# Patient Record
Sex: Female | Born: 1990 | Hispanic: Yes | Smoking: Never smoker
Health system: Southern US, Community
[De-identification: ages and names within clinical notes are randomized; demographics above are authoritative.]

## PROBLEM LIST (undated history)

## (undated) DIAGNOSIS — Z789 Other specified health status: Secondary | ICD-10-CM

## (undated) HISTORY — PX: NO PAST SURGERIES: SHX2092

---

## 2017-06-03 NOTE — L&D Delivery Note (Signed)
Patient: Megan Love MRN: [id number]  GBS status: neg  Patient is a 27 y.o. now G1P1001 s/p NSVD at 7172w4d, who was admitted for IOL for GHTN. AROM 4h 6185m prior to delivery with clear fluid.    Delivery Note At 3:29 PM a viable female was delivered via Vaginal, Spontaneous (Presentation:ROA ).  APGAR: 9, 9; weight pending .   Placenta status: spontaneous, intact. Cord: 3 vessel cord   Anesthesia:  Epidural  Episiotomy: None Lacerations:  right labial and left vaginal tear repaired Suture Repair: 3.0 monocryl ct-1 Est. Blood Loss (mL): 399 ml  Mom to postpartum.  Baby to Couplet care / Skin to Skin.  Megan Love 05/08/2018, 4:04 PM   Head delivered ROA. Nuchal cord present and easily reduced prior to delivery. Shoulder and body delivered in usual fashion. Infant with spontaneous cry, placed on mother's abdomen, dried and bulb suctioned. Cord clamped x 2 after 1-minute delay, and cut by family member. Cord blood drawn. Placenta delivered spontaneously with gentle cord traction. Fundus firm with massage and Pitocin. Perineum inspected and found to have right labial and left vaginal laceration, which was repaired with 3-0 monocryl ct-1 with good hemostasis achieved.

## 2018-01-21 ENCOUNTER — Ambulatory Visit (INDEPENDENT_AMBULATORY_CARE_PROVIDER_SITE_OTHER): Payer: Self-pay

## 2018-01-21 ENCOUNTER — Encounter: Payer: Self-pay | Admitting: Family Medicine

## 2018-01-21 DIAGNOSIS — Z3201 Encounter for pregnancy test, result positive: Secondary | ICD-10-CM

## 2018-01-21 DIAGNOSIS — Z3490 Encounter for supervision of normal pregnancy, unspecified, unspecified trimester: Secondary | ICD-10-CM

## 2018-01-21 LAB — POCT PREGNANCY, URINE: PREG TEST UR: POSITIVE — AB

## 2018-01-21 NOTE — Progress Notes (Signed)
Video Interpreter # [id number]  Pt here today for pregnancy test.  Resulted positive.  Pt reports LMP to be sometime in April.  OB US scheduled for August 29th 0845 for viability and dating.  Medications verified.  Front office to provide proof of pregnancy letter.

## 2018-01-29 ENCOUNTER — Encounter (HOSPITAL_COMMUNITY): Payer: Self-pay

## 2018-01-29 ENCOUNTER — Ambulatory Visit (HOSPITAL_COMMUNITY)
Admission: RE | Admit: 2018-01-29 | Discharge: 2018-01-29 | Disposition: A | Discharge status: Home / Self Care | Payer: Self-pay | Source: Ambulatory Visit | Attending: Obstetrics and Gynecology | Admitting: Obstetrics and Gynecology

## 2018-01-29 ENCOUNTER — Other Ambulatory Visit: Payer: Self-pay | Admitting: Obstetrics and Gynecology

## 2018-01-29 DIAGNOSIS — Z3492 Encounter for supervision of normal pregnancy, unspecified, second trimester: Secondary | ICD-10-CM

## 2018-01-29 DIAGNOSIS — Z363 Encounter for antenatal screening for malformations: Secondary | ICD-10-CM

## 2018-01-29 DIAGNOSIS — Z3A26 26 weeks gestation of pregnancy: Secondary | ICD-10-CM

## 2018-01-29 DIAGNOSIS — Z3687 Encounter for antenatal screening for uncertain dates: Secondary | ICD-10-CM | POA: Insufficient documentation

## 2018-01-29 DIAGNOSIS — Z3490 Encounter for supervision of normal pregnancy, unspecified, unspecified trimester: Secondary | ICD-10-CM

## 2018-01-30 ENCOUNTER — Other Ambulatory Visit (HOSPITAL_COMMUNITY): Payer: Self-pay | Admitting: *Deleted

## 2018-01-30 DIAGNOSIS — O0930 Supervision of pregnancy with insufficient antenatal care, unspecified trimester: Secondary | ICD-10-CM

## 2018-02-26 ENCOUNTER — Ambulatory Visit (HOSPITAL_COMMUNITY)
Admission: RE | Admit: 2018-02-26 | Discharge: 2018-02-26 | Disposition: A | Discharge status: Home / Self Care | Payer: Self-pay | Source: Ambulatory Visit | Attending: Obstetrics and Gynecology | Admitting: Obstetrics and Gynecology

## 2018-02-26 ENCOUNTER — Encounter (HOSPITAL_COMMUNITY): Payer: Self-pay

## 2018-02-26 DIAGNOSIS — O0930 Supervision of pregnancy with insufficient antenatal care, unspecified trimester: Secondary | ICD-10-CM

## 2018-02-26 DIAGNOSIS — Z3A30 30 weeks gestation of pregnancy: Secondary | ICD-10-CM | POA: Insufficient documentation

## 2018-02-26 DIAGNOSIS — O0933 Supervision of pregnancy with insufficient antenatal care, third trimester: Secondary | ICD-10-CM | POA: Insufficient documentation

## 2018-02-26 DIAGNOSIS — Z362 Encounter for other antenatal screening follow-up: Secondary | ICD-10-CM | POA: Insufficient documentation

## 2018-02-26 HISTORY — DX: Other specified health status: Z78.9

## 2018-03-02 ENCOUNTER — Encounter: Payer: Self-pay | Admitting: Advanced Practice Midwife

## 2018-03-03 NOTE — BH Specialist Note (Signed)
Integrated Behavioral Health Initial Visit  MRN: [id number] Name: Shawnise Peterkin Villalta  Number of Integrated Behavioral Health Clinician visits:: 1/6 Session Start time: 2: 32  Session End time: 2:44 Total time: 15 minutes  Type of Service: Integrated Behavioral Health- Individual/Family Interpretor:Yes.   Interpretor Name and Language: Spanish    Warm Hand Off Completed.       SUBJECTIVE: Brean Carberry is a 27 y.o. female accompanied by Partner/Significant Other Patient was referred by Leroy Libman, MD for Initial OB introduction to integrated behavioral health services . Patient reports the following symptoms/concerns: Pt and FOB state their concern today is being unfamiliar with the hospital, and uncertainty over which hospital they will have baby ([facility] or [facility]).  Duration of problem: Today; Severity of problem: mild  OBJECTIVE: Mood: Normal and Affect: Appropriate Risk of harm to self or others: No plan to harm self or others  LIFE CONTEXT: Family and Social: Pt lives with FOB School/Work: - Self-Care: - Life Changes: Current pregnancy; recent move  GOALS ADDRESSED: Patient will: INTERVENTIONS: IStandardized Assessments completed: GAD-7 and PHQ 9  ASSESSMENT: Patient currently experiencing Supervision of normal first pregnancy, third trimester   Patient may benefit from Initial OB introduction to integrated behavioral health services .  PLAN: 1. Follow up with behavioral health clinician on : As needed 2. Behavioral recommendations:  -Watch [facility] Virtual Tour again at home; share with support persons -Continue taking prenatal vitamin daily, as recommended by medical provider 3. Referral(s): Integrated [employer] (In Clinic) 4. "From scale of 1-10, how likely are you to follow plan?": 10  Rae Lips, LCSW  Depression screen [facility] 2/9 03/04/2018  Decreased Interest 1  Down, Depressed,  Hopeless 1  PHQ - 2 Score 2  Altered sleeping 1  Tired, decreased energy 1  Change in appetite 1  Feeling bad or failure about yourself  1  Trouble concentrating 0  Moving slowly or fidgety/restless 1  Suicidal thoughts 1  PHQ-9 Score 8   GAD 7 : Generalized Anxiety Score 03/04/2018  Nervous, Anxious, on Edge 0  Control/stop worrying 0  Worry too much - different things 0  Trouble relaxing 0  Restless 0  Easily annoyed or irritable 0  Afraid - awful might happen 0  Total GAD 7 Score 0

## 2018-03-04 ENCOUNTER — Ambulatory Visit (INDEPENDENT_AMBULATORY_CARE_PROVIDER_SITE_OTHER): Payer: Self-pay | Admitting: Obstetrics and Gynecology

## 2018-03-04 ENCOUNTER — Encounter: Payer: Self-pay | Admitting: Obstetrics and Gynecology

## 2018-03-04 ENCOUNTER — Ambulatory Visit: Payer: Self-pay | Admitting: Clinical

## 2018-03-04 VITALS — BP 120/78 | HR 82 | Wt 147.0 lb

## 2018-03-04 DIAGNOSIS — Z3403 Encounter for supervision of normal first pregnancy, third trimester: Secondary | ICD-10-CM

## 2018-03-04 DIAGNOSIS — Z3480 Encounter for supervision of other normal pregnancy, unspecified trimester: Secondary | ICD-10-CM

## 2018-03-04 DIAGNOSIS — Z113 Encounter for screening for infections with a predominantly sexual mode of transmission: Secondary | ICD-10-CM

## 2018-03-04 DIAGNOSIS — Z789 Other specified health status: Secondary | ICD-10-CM

## 2018-03-04 DIAGNOSIS — N898 Other specified noninflammatory disorders of vagina: Secondary | ICD-10-CM

## 2018-03-04 LAB — POCT URINALYSIS DIP (DEVICE)
Bilirubin Urine: NEGATIVE
Glucose, UA: NEGATIVE mg/dL
Hgb urine dipstick: NEGATIVE
Ketones, ur: NEGATIVE mg/dL
Leukocytes, UA: NEGATIVE
NITRITE: NEGATIVE
PH: 7 (ref 5.0–8.0)
PROTEIN: NEGATIVE mg/dL
Specific Gravity, Urine: 1.02 (ref 1.005–1.030)
UROBILINOGEN UA: 0.2 mg/dL (ref 0.0–1.0)

## 2018-03-04 NOTE — Progress Notes (Signed)
INITIAL PRENATAL VISIT NOTE  Subjective:  Megan Love is a 27 y.o. G1P0 at [redacted]w[redacted]d by 26 week [place] being seen today for her initial prenatal visit. She has an obstetric history significant for n/a. She has a medical history significant for n/a.  Patient reports no complaints.  Contractions: Not present. Vag. Bleeding: None.  Movement: Present. Denies leaking of fluid.   Past Medical History:  Diagnosis Date  . Medical history non-contributory     Past Surgical History:  Procedure Laterality Date  . NO PAST SURGERIES      OB History  Gravida Para Term Preterm AB Living  1            SAB TAB Ectopic Multiple Live Births               # Outcome Date GA Lbr Len/2nd Weight Sex Delivery Anes PTL Lv  1 Current             Social History   Socioeconomic History  . Marital status: Significant Other    Spouse name: Not on file  . Number of children: Not on file  . Years of education: Not on file  . Highest education level: Not on file  Occupational History  . Not on file  Social Needs  . Financial resource strain: Not on file  . Food insecurity:    Worry: Not on file    Inability: Not on file  . Transportation needs:    Medical: Not on file    Non-medical: Not on file  Tobacco Use  . Smoking status: Never Smoker  . Smokeless tobacco: Never Used  Substance and Sexual Activity  . Alcohol use: Not Currently  . Drug use: Not Currently  . Sexual activity: Not on file  Lifestyle  . Physical activity:    Days per week: Not on file    Minutes per session: Not on file  . Stress: Not on file  Relationships  . Social connections:    Talks on phone: Not on file    Gets together: Not on file    Attends religious service: Not on file    Active member of club or organization: Not on file    Attends meetings of clubs or organizations: Not on file    Relationship status: Not on file  Other Topics Concern  . Not on file  Social History Narrative  . Not on  file    No family history on file.   Current Outpatient Medications:  .  Prenatal Vit-Fe Fumarate-FA (PRENATAL VITAMIN PO), Take by mouth., Disp: , Rfl:   No Known Allergies  Review of Systems: Negative except for what is mentioned in HPI.  Objective:   Vitals:   03/04/18 1329  BP: 120/78  Pulse: 82  Weight: 147 lb (66.7 kg)   Fetal Status: Fetal Heart Rate (bpm): 154   Movement: Present     Physical Exam: BP 120/78   Pulse 82   Wt 147 lb (66.7 kg)  CONSTITUTIONAL: Well-developed, well-nourished female in no acute distress.  NEUROLOGIC: Alert and oriented to person, place, and time. Normal reflexes, muscle tone coordination. No cranial nerve deficit noted. PSYCHIATRIC: Normal mood and affect. Normal behavior. Normal judgment and thought content. SKIN: Skin is warm and dry. No rash noted. Not diaphoretic. No erythema. No pallor. HENT:  Normocephalic, atraumatic, External right and left ear normal. Oropharynx is clear and moist EYES: Conjunctivae and EOM are normal. Pupils are equal, round, and  reactive to light. No scleral icterus.  NECK: Normal range of motion, supple, no masses CARDIOVASCULAR: Normal heart rate noted, regular rhythm RESPIRATORY: Effort and breath sounds normal, no problems with respiration noted BREASTS: deferred ABDOMEN: Soft, nontender, nondistended, gravid. 30 cm GU: normal appearing external female genitalia, nulliparous normal appearing cervix, thick white discharge in vagina, no lesions noted Bimanual: 30 weeks sized mobile uterus, no adnexal tenderness or masses MUSCULOSKELETAL: Normal range of motion. EXT:  No edema and no tenderness. 2+ distal pulses.  Assessment and Plan:  Pregnancy: G1P0 at [redacted]w[redacted]d by 26 weeks [place]  1. Encounter for supervision of normal first pregnancy in third trimester Late to care, this is first visit - Obstetric Panel, Including HIV - Urine Culture - pap deferred to post partum  2. Language barrier Engineer, structural  used  3. Vaginal discharge Wet prep  Preterm labor symptoms and general obstetric precautions including but not limited to vaginal bleeding, contractions, leaking of fluid and fetal movement were reviewed in detail with the patient.  Please refer to After Visit Summary for other counseling recommendations.   Return in about 2 weeks (around 03/18/2018) for OB visit.  Conan Bowens 03/04/2018 2:53 PM

## 2018-03-05 LAB — OBSTETRIC PANEL, INCLUDING HIV
ANTIBODY SCREEN: NEGATIVE
BASOS: 0 %
Basophils Absolute: 0 10*3/uL (ref 0.0–0.2)
EOS (ABSOLUTE): 0 10*3/uL (ref 0.0–0.4)
EOS: 1 %
HEMATOCRIT: 34.8 % (ref 34.0–46.6)
HEMOGLOBIN: 11.5 g/dL (ref 11.1–15.9)
HEP B S AG: NEGATIVE
HIV Screen 4th Generation wRfx: NONREACTIVE
IMMATURE GRANS (ABS): 0.1 10*3/uL (ref 0.0–0.1)
Immature Granulocytes: 1 %
LYMPHS: 23 %
Lymphocytes Absolute: 1.9 10*3/uL (ref 0.7–3.1)
MCH: 30.6 pg (ref 26.6–33.0)
MCHC: 33 g/dL (ref 31.5–35.7)
MCV: 93 fL (ref 79–97)
MONOCYTES: 6 %
Monocytes Absolute: 0.5 10*3/uL (ref 0.1–0.9)
Neutrophils Absolute: 5.7 10*3/uL (ref 1.4–7.0)
Neutrophils: 69 %
Platelets: 254 10*3/uL (ref 150–450)
RBC: 3.76 x10E6/uL — ABNORMAL LOW (ref 3.77–5.28)
RDW: 12.8 % (ref 12.3–15.4)
RPR: NONREACTIVE
RUBELLA: 2.76 {index} (ref >0.99)
Rh Factor: POSITIVE
WBC: 8.2 10*3/uL (ref 3.4–10.8)

## 2018-03-05 LAB — HEMOGLOBIN A1C
ESTIMATED AVERAGE GLUCOSE: 105 mg/dL
HEMOGLOBIN A1C: 5.3 % (ref 4.8–5.6)

## 2018-03-06 DIAGNOSIS — Z3480 Encounter for supervision of other normal pregnancy, unspecified trimester: Secondary | ICD-10-CM | POA: Insufficient documentation

## 2018-03-06 LAB — CERVICOVAGINAL ANCILLARY ONLY
BACTERIAL VAGINITIS: NEGATIVE
CHLAMYDIA, DNA PROBE: NEGATIVE
Candida vaginitis: NEGATIVE
NEISSERIA GONORRHEA: NEGATIVE
Trichomonas: NEGATIVE

## 2018-03-06 LAB — URINE CULTURE

## 2018-03-09 MED ORDER — CEPHALEXIN 500 MG PO CAPS: 500.0000 mg | ORAL_CAPSULE | Freq: Four times a day (QID) | ORAL | 0 refills | Status: DC

## 2018-03-09 NOTE — Addendum Note (Signed)
Addended by: Leroy Libman on: 03/09/2018 07:16 PM   Modules accepted: Orders

## 2018-03-10 ENCOUNTER — Other Ambulatory Visit: Payer: Self-pay

## 2018-03-10 DIAGNOSIS — Z3403 Encounter for supervision of normal first pregnancy, third trimester: Secondary | ICD-10-CM

## 2018-03-10 MED ORDER — CEPHALEXIN 500 MG PO CAPS: 500.0000 mg | ORAL_CAPSULE | Freq: Four times a day (QID) | ORAL | 0 refills | Status: DC

## 2018-03-10 NOTE — Progress Notes (Signed)
Patient wanted the medication sent to a different pharmacy

## 2018-03-17 ENCOUNTER — Other Ambulatory Visit: Payer: Self-pay | Admitting: *Deleted

## 2018-03-17 DIAGNOSIS — Z3403 Encounter for supervision of normal first pregnancy, third trimester: Secondary | ICD-10-CM

## 2018-03-19 ENCOUNTER — Ambulatory Visit (INDEPENDENT_AMBULATORY_CARE_PROVIDER_SITE_OTHER): Payer: Self-pay | Admitting: Obstetrics and Gynecology

## 2018-03-19 ENCOUNTER — Other Ambulatory Visit: Payer: Self-pay

## 2018-03-19 DIAGNOSIS — Z3403 Encounter for supervision of normal first pregnancy, third trimester: Secondary | ICD-10-CM

## 2018-03-19 DIAGNOSIS — O2343 Unspecified infection of urinary tract in pregnancy, third trimester: Secondary | ICD-10-CM

## 2018-03-19 DIAGNOSIS — Z3480 Encounter for supervision of other normal pregnancy, unspecified trimester: Secondary | ICD-10-CM

## 2018-03-19 DIAGNOSIS — Z23 Encounter for immunization: Secondary | ICD-10-CM

## 2018-03-19 MED ORDER — CEPHALEXIN 500 MG PO CAPS: 500.0000 mg | ORAL_CAPSULE | Freq: Four times a day (QID) | ORAL | 0 refills | Status: DC

## 2018-03-19 NOTE — Progress Notes (Signed)
   PRENATAL VISIT NOTE  Subjective:  Megan Love is a 27 y.o. G1P0 at [redacted]w[redacted]d being seen today for ongoing prenatal care.  She is currently monitored for the following issues for this low-risk pregnancy and has Supervision of other normal pregnancy, antepartum and UTI in pregnancy, antepartum, third trimester on their problem list.  Patient reports no complaints.  Contractions: Not present. Vag. Bleeding: None.  Movement: Present. Denies leaking of fluid.   The following portions of the patient's history were reviewed and updated as appropriate: allergies, current medications, past family history, past medical history, past social history, past surgical history and problem list. Problem list updated.  Objective:   Vitals:   03/19/18 0849  BP: 114/78  Pulse: 75  Weight: 148 lb 6.4 oz (67.3 kg)    Fetal Status: Fetal Heart Rate (bpm): 152 Fundal Height: 33 cm Movement: Present     General:  Alert, oriented and cooperative. Patient is in no acute distress.  Skin: Skin is warm and dry. No rash noted.   Cardiovascular: Normal heart rate noted  Respiratory: Normal respiratory effort, no problems with respiration noted  Abdomen: Soft, gravid, appropriate for gestational age.  Pain/Pressure: Present     Pelvic: Cervical exam deferred        Extremities: Normal range of motion.  Edema: None  Mental Status: Normal mood and affect. Normal behavior. Normal judgment and thought content.   Assessment and Plan:  Pregnancy: G1P0 at [redacted]w[redacted]d  1. Supervision of other normal pregnancy, antepartum  - Flu Vaccine QUAD 36+ mos IM - Tdap vaccine greater than or equal to 7yo IM - Self pay: discussed genetic testing, patient declined due to cost.   2. UTI in pregnancy, antepartum, third trimester  - + urine culture RX Keflex printed and given to Patient   Other orders - cephALEXin (KEFLEX) 500 MG capsule; Take 1 capsule (500 mg total) by mouth 4 (four) times daily.  There are no  diagnoses linked to this encounter. Preterm labor symptoms and general obstetric precautions including but not limited to vaginal bleeding, contractions, leaking of fluid and fetal movement were reviewed in detail with the patient. Please refer to After Visit Summary for other counseling recommendations.  Return in about 2 weeks (around 04/02/2018).  No future appointments.  Venia Carbon, NP

## 2018-03-19 NOTE — Patient Instructions (Addendum)
Vacuna contra la difteria, el tétanos y la tosferina acelular (DTaP): Lo que debe saber  (DTaP Vaccine (Diphtheria, Tetanus, and Pertussis): What You Need to Know)  1. ¿Por qué vacunarse?  La difteria, el tétanos y la tosferina son enfermedades graves causadas por bacterias. La difteria y la tos ferina se contagian de persona a persona. El tétanos ingresa al organismo a través de cortes o heridas.  La DIFTERIA produce la formación de una membrana gruesa que cubre el fondo de la garganta.  · Puede causar problemas respiratorios, parálisis, insuficiencia cardíaca e incluso la muerte.  El TÉTANOS (trismo) provoca la contracción dolorosa de los músculos, por lo general, en todo el cuerpo.  · Puede derivar en la obstrucción de la mandíbula, lo que impide que la persona abra la boca o trague. El tétanos causa la muerte en 2 de cada 10 casos.  La PERTUSIS (tosferina) causa episodios de tos tan intensos que los bebés tienen dificultades para comer, beber y respirar. Estos ataques pueden durar semanas.  · Puede causar neumonía, convulsiones (sacudidas o la mirada perdida), daño cerebral y muerte.  La vacuna contra la difteria, el tétanos y la tosferina acelular (DTaP) puede ayudar a prevenir estas enfermedades. La mayoría de los niños que reciben la vacuna DTaP estarán protegidos durante toda la infancia. Muchos niños contraerían estas enfermedades si interrumpiésemos la vacunación.  La DTaP es una versión más segura de una vacuna anterior llamada DTP. La DTP ya no se usa en los [place].  2. ¿Quiénes y en qué momento deben recibir la vacuna DTaP?  Los niños deben recibir 5 dosis de la vacuna DTaP, una dosis en cada una de estas edades:  · 2 meses  · 4 meses  · 6 meses  · de 15 a 18 meses  · de 4 a 6 años  La DTaP puede aplicarse en el mismo momento que otras vacunas.  3. Algunos niños no deben recibir la vacuna DTaP o deben esperar  · Los niños con enfermedades menores, como un resfrío, pueden vacunarse.  Pero los niños que tienen enfermedades moderadas o graves generalmente deben esperar hasta recuperarse para poder recibir la vacuna DTaP.  · Un niño que ha tenido una reacción alérgica potencialmente mortal después de una dosis de la vacuna DTaP no debe recibir otra dosis.  · Un niño que ha sufrido una enfermedad cerebral o del sistema nervioso en el transcurso de 7 días de haber recibido una dosis de la vacuna DTaP no debe recibir otra dosis.  · Hable con el pediatra si el niño:  ? tuvo una convulsión o se desmayó después de una dosis de la vacuna DTaP,  ? lloró sin parar durante 3 horas o más después de una dosis de la vacuna DTaP,  ? tuvo fiebre de más de 105 °F después de una dosis de la vacuna DTaP.  Consulte a su médico para obtener más información. Algunos de estos niños no deben recibir otra dosis de la vacuna contra la tosferina, pero pueden recibir una vacuna que no la incluya, llamada DT.  4. Niños mayores y adultos  La vacuna DTaP no está autorizada para adolescentes, adultos y niños de 7 años en adelante.  No obstante, las personas de estas edades necesitan protección. La vacuna llamada Tdap es similar a la DTaP. Se recomienda una sola dosis de Tdap para las personas de 11 a 64 años. Otra vacuna llamada Td protege contra el tétanos y la difteria, pero no contra la tosferina. Se recomienda aplicarla cada 10 años. Hay diferentes hojas informativas sobre estas vacunas.  5. ¿Cuáles   son los riesgos de la vacuna DTaP?  Enfermarse de tétanos, difteria o tosferina es mucho más riesgoso que aplicarse la vacuna DTaP.  Sin embargo, una vacuna, al igual que cualquier medicamento, puede causar problemas serios, como reacciones alérgicas graves. El riesgo de que la vacuna DTaP cause daños graves, o la muerte, es sumamente pequeño.  Problemas leves (frecuentes)  · Fiebre (alrededor de 1 de cada 4 niños)  · Enrojecimiento o hinchazón en el lugar de la inyección (alrededor de 1 de cada 4 niños)   · Molestias o dolor con la palpación en el lugar de la inyección (alrededor de 1 de cada 4 niños)  Estos problemas son más frecuentes después de la cuarta y la quinta dosis de la serie DTaP que después de las primeras dosis. A veces, después de la cuarta o la quinta dosis de la vacuna DTaP, se hincha todo el brazo o la pierna donde se aplicó la inyección, lo que dura de 1 a 7 días (alrededor de 1 de cada 30 niños).  Entre los otros problemas leves, se incluyen los siguientes:  · Irritabilidad (alrededor de 1 de cada 3 niños)  · Cansancio o poco apetito (alrededor de 1 de cada 10 niños)  · Vómitos (alrededor de 1 de cada 50 niños)  En general, estos problemas aparecen de 1 a 3 días después de la inyección.  Problemas moderados (poco frecuentes)  · Convulsiones (sacudidas o mirada perdida) (alrededor de 1 de cada 14 000 niños)  · Llanto incesante durante 3 horas o más (alrededor de 1 de cada 1000 niños)  · Fiebre alta, superior a 105 °F (alrededor de 1 de cada 16 000 niños)  Problemas graves (muy poco frecuentes)  · Reacción alérgica grave (menos de 1 caso cada un millón de dosis)  · Se han informado otros diversos problemas graves después de la vacuna DTaP. Estos incluyen:  ? Convulsiones crónicas, coma o disminución de la conciencia.  ? Lesiones cerebrales permanentes.  Estos son tan poco frecuentes que es difícil determinar si la causa es la vacuna.  El control de la fiebre es especialmente importante en los niños que han tenido convulsiones, cualquiera sea el motivo. También es importante si otro familiar ha tenido convulsiones. Puede disminuir la fiebre y el dolor si le da al niño un analgésico sin aspirina en el momento de la aplicación y durante las 24 horas siguientes, según las indicaciones del envase.  6. ¿Qué pasa si hay una reacción grave?  ¿A qué signos debo estar atento?  · Observe todo lo que le preocupe, como signos de una reacción alérgica grave, fiebre muy alta o cambios en el comportamiento.   Los signos de una reacción alérgica grave pueden incluir ronchas, hinchazón de la cara y la garganta, dificultad para respirar, latidos cardíacos acelerados, mareos y debilidad. Pueden comenzar entre unos pocos minutos y algunas horas después de la vacunación.  ¿Qué debo hacer?  · Si usted piensa que se trata de una reacción alérgica grave o de otra emergencia que no puede esperar, llame al 911 o diríjase al hospital más cercano. Sino, llame a su médico.  · Después, la reacción debe informarse al Sistema de Información sobre Efectos Adversos de las Vacunas (Vaccine Adverse Event Reporting System, VAERS). Su médico puede presentar este informe, o puede hacerlo usted mismo a través del sitio web de VAERS, en www.vaers.hhs.gov, o llamando al 1-800-822-7967.  El VAERS es solo para informar reacciones. No brindan consejo médico.  7. Programa Nacional de Compensación de Daños por Vacunas  El Programa Nacional de Compensación de Daños por Vacunas (National Vaccine Injury Compensation Program, VICP) es un programa federal que fue creado   el servicio de salud de su localidad o [address].  Comunquese con los Centros para Air traffic controller y la Prevencin de Child psychotherapist for Disease Control and Prevention , CDC). ? Llame al (412)783-9958 (1-800-CDC-INFO), o ? Visite la pgina Environmental manager en PicCapture.uy Declaracin de informacin sobre la vacuna contra la difteria, el ttanos y la tosferina Manufacturing engineer (DTaP) de los CDC (10/17/05) Esta informacin no tiene Theme park manager el consejo del mdico. Asegrese de hacerle  al mdico cualquier pregunta que tenga. Document Released: 08/16/2008 Document Revised: 06/10/2014 Document Reviewed: 07/01/2013 Elsevier Interactive Patient Education  2017 Elsevier Inc.  [employer] del mtodo anticonceptivo (Contraception Choices) La anticoncepcin (control de la natalidad) es el uso de cualquier mtodo o dispositivo para Location manager. A continuacin se indican algunos de esos mtodos. ANTICONCEPTIVOS HORMONALES  Un pequeo tubo colocado bajo la piel de la parte superior del brazo (implante). El tubo puede Geneticist, molecular durante 3 aos. El implante debe quitarse despus de 3 aos.  Inyecciones que se aplican cada 3 meses.  Pldoras que deben [employer].  Parches que se cambian una vez por semana.  Un anillo que se coloca en la vagina (anillo vaginal). El anillo se deja en su lugar durante 3 semanas y se retira durante 1 semana. Luego se coloca un nuevo anillo en la vagina.  Pldoras para el control de la natalidad despus de Warehouse manager sexo (relaciones sexuales) sin proteccin.  ANTICONCEPTIVOS DE Emeline Darling cubierta delgada que se [place] sobe el pene (condn masculino) que se coloca durante las relaciones sexuales.  Una cubierta blanda y suelta que se coloca en la vagina (condn femenino) antes de las relaciones sexuales.  Un dispositivo de goma que se aplica sobre el cuello del tero (diafragma). Este dispositivo debe ser hecho para usted. Se coloca en la vagina antes de Management consultant. Debe dejarlo colocado en la vagina durante 6 a 8 horas despus de las [employer].  Un capuchn pequeo y [employer] se fija sobre el cuello del tero (capuchn cervical). Este capuchn debe ser hecho para usted. Debe dejarlo colocado en la vagina durante 48 horas despus de las [employer].  Una esponja que se coloca en la vagina antes de Management consultant.  Una sustancia qumica que destruye o impide que los espermatozoides  ingresen al cuello y al tero (espermicida). La sustancia qumica puede ser en crema, gel, espuma o pldoras.  DISPOSITIVO DE CONTROL INTRAUTERINO (DIU)  El DIU es un pequeo dispositivo plstico en forma de T. Se coloca dentro del tero. Hay dos tipos de DIU: ? DIU de cobre. El dispositivo est recubierto en alambre de cobre. El cobre produce un lquido que [employer] espermatozoides. Puede permanecer colocado durante 10 aos. ? DIU hormonal. La hormona impide que ocurra el embarazo. Puede permanecer colocado durante 5 aos.  MTODOS PERMANENTES  La mujer puede hacerse sellar, ligar u obstruir las trompas de [place] durante [place]. Esto impide que el vulo llegue hasta el tero.  El mdico coloca un alambre diminuto o lo inserta en cada una de las trompas de [place]. Esto produce un tejido cicatrizal que obstruye las trompas de [place].  En el hombre pueden ligarse los conductos por los que pasan los espermatozoides (vasectoma).  CONTROL DE LA NATALIDAD POR PLANIFICACIN FAMILIAR NATURAL  La planificacin familiar natural significa no tener Clinical research associate o usar un mtodo anticonceptivo de Warehouse manager en los perodos frtiles de la [place].  Use a un  almanaque para llevar un registro de la extensin de cada perodo y para [place] que puede quedar [place].  Evite tener relaciones sexuales durante la ovulacin.  Use un termmetro para medir la Arts development officer. Tambin reconozca los sntomas de la ovulacin.  El momento de [employer] relaciones sexuales debe ser despus de que la mujer [place]. Use condones para protegerse de las enfermedades de transmisin sexual (ETS). Hgalo independientemente del tipo de [employer] use. Hable con su mdico acerca de cul es el mejor mtodo anticonceptivo para usted. Esta informacin no tiene Theme park manager el consejo del mdico. Asegrese de hacerle al mdico cualquier pregunta que tenga. Document Released:  09/04/2010 Document Revised: 05/25/2013 Document Reviewed: 12/09/2012 Elsevier Interactive Patient Education  2017 [employer].

## 2018-03-20 LAB — GLUCOSE TOLERANCE, 2 HOURS W/ 1HR
GLUCOSE, 1 HOUR: 107 mg/dL (ref 65–179)
GLUCOSE, 2 HOUR: 101 mg/dL (ref 65–152)
Glucose, Fasting: 80 mg/dL (ref 65–91)

## 2018-04-08 ENCOUNTER — Ambulatory Visit (INDEPENDENT_AMBULATORY_CARE_PROVIDER_SITE_OTHER): Payer: Self-pay | Admitting: Nurse Practitioner

## 2018-04-08 VITALS — BP 110/82 | HR 82 | Wt 152.0 lb

## 2018-04-08 DIAGNOSIS — Z113 Encounter for screening for infections with a predominantly sexual mode of transmission: Secondary | ICD-10-CM

## 2018-04-08 DIAGNOSIS — Z3480 Encounter for supervision of other normal pregnancy, unspecified trimester: Secondary | ICD-10-CM

## 2018-04-08 DIAGNOSIS — Z3483 Encounter for supervision of other normal pregnancy, third trimester: Secondary | ICD-10-CM

## 2018-04-08 LAB — OB RESULTS CONSOLE GC/CHLAMYDIA: Gonorrhea: NEGATIVE

## 2018-04-08 LAB — OB RESULTS CONSOLE GBS: STREP GROUP B AG: NEGATIVE

## 2018-04-08 NOTE — Progress Notes (Signed)
    Subjective:  Megan Love is a 27 y.o. G1P0 at [redacted]w[redacted]d being seen today for ongoing prenatal care.  She is currently monitored for the following issues for this low-risk pregnancy and has Supervision of other normal pregnancy, antepartum and UTI in pregnancy, antepartum, third trimester on their problem list.  Patient reports no complaints.  Contractions: Not present. Vag. Bleeding: None.  Movement: Present. Denies leaking of fluid.   The following portions of the patient's history were reviewed and updated as appropriate: allergies, current medications, past family history, past medical history, past social history, past surgical history and problem list. Problem list updated.  Objective:   Vitals:   04/08/18 1042  BP: 110/82  Pulse: 82  Weight: 152 lb (68.9 kg)    Fetal Status: Fetal Heart Rate (bpm): 146 Fundal Height: 34 cm Movement: Present     General:  Alert, oriented and cooperative. Patient is in no acute distress.  Skin: Skin is warm and dry. No rash noted.   Cardiovascular: Normal heart rate noted  Respiratory: Normal respiratory effort, no problems with respiration noted  Abdomen: Soft, gravid, appropriate for gestational age. Pain/Pressure: Absent     Pelvic:  Cervical exam deferred        Extremities: Normal range of motion.  Edema: None  Mental Status: Normal mood and affect. Normal behavior. Normal judgment and thought content.   Urinalysis:      Assessment and Plan:  Pregnancy: G1P0 at [redacted]w[redacted]d  1. Supervision of other normal pregnancy, antepartum  - Culture, beta strep (group b only) - GC/Chlamydia probe amp ([place])not at [facility]  Term labor symptoms and general obstetric precautions including but not limited to vaginal bleeding, contractions, leaking of fluid and fetal movement were reviewed in detail with the patient. Please refer to After Visit Summary for other counseling recommendations.  Return in about 1 week (around  04/15/2018).  Nolene Bernheim, RN, MSN, NP-BC Nurse Practitioner, [facility] [employer], [facility] 04/08/2018 1:49 PM

## 2018-04-08 NOTE — Patient Instructions (Addendum)
AREA PEDIATRIC/FAMILY PRACTICE PHYSICIANS  [facility] [address], [address] [place], [place]  [address] Phone - [phone]   Fax - [phone]  [facility] [address] [place], [place] [address] Phone - [phone]   Fax - [phone]  [facility] [address] [place], [place]  [address] Phone - [phone]   Fax - [phone]  [facility] [address] [place], [place]  [address] Phone - [phone]   Fax - [phone]  [facility] [address] [place], [place]  [address] Phone - [phone]   Fax - [phone]  [facility] [address] [place], [place]  [address] Phone - [phone]   Fax - [phone]  [facility] [address] [place], [place]  [address] Phone - [phone]   Fax - [phone]  [facility] [address] [place], [place]  [address] Phone - [phone]   Fax - [phone]  [facility] [address] [place], [place]  [address] Phone - [phone]   Fax - [phone] [facility] [address] [place], [place]  [address] Phone - [phone]   Fax - [phone]  [facility] [address] [place], [place]  [address] Phone - [phone]   Fax - [phone]  [facility] [address] [place], [place]  [address] Phone - [phone]   Fax - [phone]  [facility] [address] [place], [place]  [address] Phone - [phone]   Fax - [phone]  [facility]IL[facility] GOSRANI [address] [place], [place]  [address] Phone - [phone]  [facility] [address] [place], [place]  [address] Phone - [phone]   Fax - [phone]  [facility] [address] [place], [place]  [address] Phone - [phone]   Fax - [phone]  [facility] [address] [place], [place]  [address] Phone - [phone]   Fax - [phone]  [facility] [address] [place], [place]  [address] Phone - [phone]   Fax - [phone]   [facility] [address] [place], [place]  [address] Phone - [phone]   Fax - [phone]  [facility] [address] [place], [place]  [address] Phone - [phone]   Fax - [phone]  [facility] RUB[facility] [address] [place], [place]  [address] Phone - [phone]   Fax - [phone]  [facility] [address] , [place]  [address] Phone - [phone]   Fax - [phone]  [facility] [address] , [place]  [address] Phone - [phone]   Fax - [phone] [facility] [address] [place], [place]  [address] Phone - [phone]   Fax - [phone]  [facility] [address] [place], [place]  [address] Phone - [phone]   Fax - [phone]  [facility] [address] [place], [place]  [address] Phone - [phone]   Fax - [phone]  [facility] Charlene Flemming MD [address] [place]  [address] Phone [phone]  Fax [phone]    Braxton Hicks Contractions Contractions of the uterus can occur throughout pregnancy, but they   are not always a sign that you are in labor. You may have practice contractions called Braxton Hicks contractions. These false labor contractions are sometimes confused with true labor. What are Braxton Hicks contractions? Braxton Hicks contractions are tightening movements that occur in the muscles of the uterus before labor. Unlike true labor contractions, these contractions do not result in  opening (dilation) and thinning of the cervix. Toward the end of pregnancy (32-34 weeks), Braxton Hicks contractions can happen more often and may become stronger. These contractions are sometimes difficult to tell apart from true labor because they can be very uncomfortable. You should not feel embarrassed if you go to the hospital with false labor. Sometimes, the only way to tell if you are in true labor is for your health care provider to look for changes in the cervix. The health care provider will do a physical exam and may monitor your contractions. If you are not in true labor, the exam should show that your cervix is not dilating and your water has not broken. If there are other health problems associated with your pregnancy, it is completely safe for you to be sent home with false labor. You may continue to have Braxton Hicks contractions until you go into true labor. How to tell the difference between true labor and false labor True labor  Contractions last 30-70 seconds.  Contractions become very regular.  Discomfort is usually felt in the top of the uterus, and it spreads to the lower abdomen and low back.  Contractions do not go away with walking.  Contractions usually become more intense and increase in frequency.  The cervix dilates and gets thinner. False labor  Contractions are usually shorter and not as strong as true labor contractions.  Contractions are usually irregular.  Contractions are often felt in the front of the lower abdomen and in the groin.  Contractions may go away when you walk around or change positions while lying down.  Contractions get weaker and are shorter-lasting as time goes on.  The cervix usually does not dilate or become thin. Follow these instructions at home:  Take over-the-counter and prescription medicines only as told by your health care provider.  Keep up with your usual exercises and follow other instructions from your health care  provider.  Eat and drink lightly if you think you are going into labor.  If Braxton Hicks contractions are making you uncomfortable: ? Change your position from lying down or resting to walking, or change from walking to resting. ? Sit and rest in a tub of warm water. ? Drink enough fluid to keep your urine pale yellow. Dehydration may cause these contractions. ? Do slow and deep breathing several times an hour.  Keep all follow-up prenatal visits as told by your health care provider. This is important. Contact a health care provider if:  You have a fever.  You have continuous pain in your abdomen. Get help right away if:  Your contractions become stronger, more regular, and closer together.  You have fluid leaking or gushing from your vagina.  You pass blood-tinged mucus (bloody show).  You have bleeding from your vagina.  You have low back pain that you never had before.  You feel your baby's head pushing down and causing pelvic pressure.  Your baby is not moving inside you as much as it used to. Summary  Contractions that occur before labor are called Braxton Hicks contractions, false labor, or practice contractions.  Braxton Hicks contractions are   usually shorter, weaker, farther apart, and less regular than true labor contractions. True labor contractions usually become progressively stronger and regular and they become more frequent.  Manage discomfort from Braxton Hicks contractions by changing position, resting in a warm bath, drinking plenty of water, or practicing deep breathing. This information is not intended to replace advice given to you by your health care provider. Make sure you discuss any questions you have with your health care provider. Document Released: 10/03/2016 Document Revised: 10/03/2016 Document Reviewed: 10/03/2016 Elsevier Interactive Patient Education  2018 Elsevier Inc.  

## 2018-04-09 LAB — GC/CHLAMYDIA PROBE AMP (~~LOC~~) NOT AT ARMC
Chlamydia: NEGATIVE
Neisseria Gonorrhea: NEGATIVE

## 2018-04-12 LAB — CULTURE, BETA STREP (GROUP B ONLY): Strep Gp B Culture: NEGATIVE

## 2018-04-15 ENCOUNTER — Ambulatory Visit (INDEPENDENT_AMBULATORY_CARE_PROVIDER_SITE_OTHER): Payer: Self-pay | Admitting: Nurse Practitioner

## 2018-04-15 VITALS — BP 138/64 | HR 74 | Wt 155.7 lb

## 2018-04-15 DIAGNOSIS — Z3480 Encounter for supervision of other normal pregnancy, unspecified trimester: Secondary | ICD-10-CM

## 2018-04-15 DIAGNOSIS — Z3483 Encounter for supervision of other normal pregnancy, third trimester: Secondary | ICD-10-CM

## 2018-04-15 NOTE — Progress Notes (Addendum)
    Subjective:  Megan Love is a 27 y.o. G1P0 at 6348w2d being seen today for ongoing prenatal care.  She is currently monitored for the following issues for this low-risk pregnancy and has Supervision of other normal pregnancy, antepartum and UTI in pregnancy, antepartum, third trimester on their problem list.  Patient reports no complaints.  Contractions: Not present. Vag. Bleeding: None.  Movement: Present. Denies leaking of fluid.   The following portions of the patient's history were reviewed and updated as appropriate: allergies, current medications, past family history, past medical history, past social history, past surgical history and problem list. Problem list updated.  Objective:   Vitals:   04/15/18 1529  BP: 138/64  Pulse: 74  Weight: 155 lb 11.2 oz (70.6 kg)    Fetal Status: Fetal Heart Rate (bpm): 152 Fundal Height: 35 cm Movement: Present     General:  Alert, oriented and cooperative. Patient is in no acute distress.  Skin: Skin is warm and dry. No rash noted.   Cardiovascular: Normal heart rate noted  Respiratory: Normal respiratory effort, no problems with respiration noted  Abdomen: Soft, gravid, appropriate for gestational age. Pain/Pressure: Absent     Pelvic:  Cervical exam deferred        Extremities: Normal range of motion.  Edema: None  Mental Status: Normal mood and affect. Normal behavior. Normal judgment and thought content.   Urinalysis:      Assessment and Plan:  Pregnancy: G1P0 at 948w2d  1. Supervision of other normal pregnancy, antepartum Never has had urine culture after finishing medication for UTI so will do urine culture today.  Does not have any symptoms today and previously did have symptoms.  Term labor symptoms and general obstetric precautions including but not limited to vaginal bleeding, contractions, leaking of fluid and fetal movement were reviewed in detail with the patient. Please refer to After Visit Summary for  other counseling recommendations.  Return in about 1 week (around 04/22/2018).  Nolene BernheimERRI BURLESON, RN, MSN, NP-BC Nurse Practitioner, [facility] [employer], [facility] 04/15/2018 3:56 PM

## 2018-04-17 LAB — URINE CULTURE, OB REFLEX: ORGANISM ID, BACTERIA: NO GROWTH

## 2018-04-17 LAB — CULTURE, OB URINE

## 2018-04-22 ENCOUNTER — Ambulatory Visit (INDEPENDENT_AMBULATORY_CARE_PROVIDER_SITE_OTHER): Payer: Self-pay | Admitting: Nurse Practitioner

## 2018-04-22 VITALS — BP 118/78 | HR 55 | Wt 156.6 lb

## 2018-04-22 DIAGNOSIS — Z3A38 38 weeks gestation of pregnancy: Secondary | ICD-10-CM

## 2018-04-22 DIAGNOSIS — Z3480 Encounter for supervision of other normal pregnancy, unspecified trimester: Secondary | ICD-10-CM

## 2018-04-22 DIAGNOSIS — Z3483 Encounter for supervision of other normal pregnancy, third trimester: Secondary | ICD-10-CM

## 2018-04-22 NOTE — Patient Instructions (Signed)
Braxton Hicks Contractions °Contractions of the uterus can occur throughout pregnancy, but they are not always a sign that you are in labor. You may have practice contractions called Braxton Hicks contractions. These false labor contractions are sometimes confused with true labor. °What are Braxton Hicks contractions? °Braxton Hicks contractions are tightening movements that occur in the muscles of the uterus before labor. Unlike true labor contractions, these contractions do not result in opening (dilation) and thinning of the cervix. Toward the end of pregnancy (32-34 weeks), Braxton Hicks contractions can happen more often and may become stronger. These contractions are sometimes difficult to tell apart from true labor because they can be very uncomfortable. You should not feel embarrassed if you go to the hospital with false labor. °Sometimes, the only way to tell if you are in true labor is for your health care provider to look for changes in the cervix. The health care provider will do a physical exam and may monitor your contractions. If you are not in true labor, the exam should show that your cervix is not dilating and your water has not broken. °If there are other health problems associated with your pregnancy, it is completely safe for you to be sent home with false labor. You may continue to have Braxton Hicks contractions until you go into true labor. °How to tell the difference between true labor and false labor °True labor °· Contractions last 30-70 seconds. °· Contractions become very regular. °· Discomfort is usually felt in the top of the uterus, and it spreads to the lower abdomen and low back. °· Contractions do not go away with walking. °· Contractions usually become more intense and increase in frequency. °· The cervix dilates and gets thinner. °False labor °· Contractions are usually shorter and not as strong as true labor contractions. °· Contractions are usually irregular. °· Contractions  are often felt in the front of the lower abdomen and in the groin. °· Contractions may go away when you walk around or change positions while lying down. °· Contractions get weaker and are shorter-lasting as time goes on. °· The cervix usually does not dilate or become thin. °Follow these instructions at home: °· Take over-the-counter and prescription medicines only as told by your health care provider. °· Keep up with your usual exercises and follow other instructions from your health care provider. °· Eat and drink lightly if you think you are going into labor. °· If Braxton Hicks contractions are making you uncomfortable: °? Change your position from lying down or resting to walking, or change from walking to resting. °? Sit and rest in a tub of warm water. °? Drink enough fluid to keep your urine pale yellow. Dehydration may cause these contractions. °? Do slow and deep breathing several times an hour. °· Keep all follow-up prenatal visits as told by your health care provider. This is important. °Contact a health care provider if: °· You have a fever. °· You have continuous pain in your abdomen. °Get help right away if: °· Your contractions become stronger, more regular, and closer together. °· You have fluid leaking or gushing from your vagina. °· You pass blood-tinged mucus (bloody show). °· You have bleeding from your vagina. °· You have low back pain that you never had before. °· You feel your baby’s head pushing down and causing pelvic pressure. °· Your baby is not moving inside you as much as it used to. °Summary °· Contractions that occur before labor are called Braxton   Hicks contractions, false labor, or practice contractions. °· Braxton Hicks contractions are usually shorter, weaker, farther apart, and less regular than true labor contractions. True labor contractions usually become progressively stronger and regular and they become more frequent. °· Manage discomfort from Braxton Hicks contractions by  changing position, resting in a warm bath, drinking plenty of water, or practicing deep breathing. °This information is not intended to replace advice given to you by your health care provider. Make sure you discuss any questions you have with your health care provider. °Document Released: 10/03/2016 Document Revised: 10/03/2016 Document Reviewed: 10/03/2016 °Elsevier Interactive Patient Education © 2018 Elsevier Inc. ° °

## 2018-04-22 NOTE — Progress Notes (Signed)
    Subjective:  Megan Love is a 27 y.o. G1P0 at 7337w2d being seen today for ongoing prenatal care.  She is currently monitored for the following issues for this low-risk pregnancy and has Supervision of other normal pregnancy, antepartum and UTI in pregnancy, antepartum, third trimester on their problem list.  Patient reports no complaints.  Contractions: Not present. Vag. Bleeding: None.  Movement: Present. Denies leaking of fluid.   The following portions of the patient's history were reviewed and updated as appropriate: allergies, current medications, past family history, past medical history, past social history, past surgical history and problem list. Problem list updated.  Objective:   Vitals:   04/22/18 0915  BP: 118/78  Pulse: (!) 55  Weight: 156 lb 9.6 oz (71 kg)    Fetal Status: Fetal Heart Rate (bpm): 130 Fundal Height: 37 cm Movement: Present     General:  Alert, oriented and cooperative. Patient is in no acute distress.  Skin: Skin is warm and dry. No rash noted.   Cardiovascular: Normal heart rate noted  Respiratory: Normal respiratory effort, no problems with respiration noted  Abdomen: Soft, gravid, appropriate for gestational age. Pain/Pressure: Absent     Pelvic:  Cervical exam deferred        Extremities: Normal range of motion.  Edema: None  Mental Status: Normal mood and affect. Normal behavior. Normal judgment and thought content.   Urinalysis:      Assessment and Plan:  Pregnancy: G1P0 at 6537w2d  1. Supervision of other normal pregnancy, antepartum Doing well.  Term labor symptoms and general obstetric precautions including but not limited to vaginal bleeding, contractions, leaking of fluid and fetal movement were reviewed in detail with the patient. Please refer to After Visit Summary for other counseling recommendations.  Return in about 1 week (around 04/29/2018).  Nolene BernheimERRI Lynett Brasil, RN, MSN, NP-BC Nurse Practitioner, [facility]  [facility] [employer], [facility] 04/22/2018 9:39 AM

## 2018-04-29 ENCOUNTER — Ambulatory Visit (INDEPENDENT_AMBULATORY_CARE_PROVIDER_SITE_OTHER): Payer: Self-pay | Admitting: Internal Medicine

## 2018-04-29 ENCOUNTER — Inpatient Hospital Stay (HOSPITAL_COMMUNITY)
Admission: AD | Admit: 2018-04-29 | Discharge: 2018-04-29 | Disposition: A | Discharge status: Home / Self Care | Payer: Self-pay | Source: Ambulatory Visit | Attending: Family Medicine | Admitting: Family Medicine

## 2018-04-29 ENCOUNTER — Other Ambulatory Visit: Payer: Self-pay

## 2018-04-29 ENCOUNTER — Encounter (HOSPITAL_COMMUNITY): Payer: Self-pay

## 2018-04-29 VITALS — BP 124/91 | HR 99 | Wt 159.7 lb

## 2018-04-29 DIAGNOSIS — Z3A39 39 weeks gestation of pregnancy: Secondary | ICD-10-CM | POA: Insufficient documentation

## 2018-04-29 DIAGNOSIS — O163 Unspecified maternal hypertension, third trimester: Secondary | ICD-10-CM

## 2018-04-29 DIAGNOSIS — Z3480 Encounter for supervision of other normal pregnancy, unspecified trimester: Secondary | ICD-10-CM

## 2018-04-29 DIAGNOSIS — O133 Gestational [pregnancy-induced] hypertension without significant proteinuria, third trimester: Secondary | ICD-10-CM

## 2018-04-29 LAB — URINALYSIS, ROUTINE W REFLEX MICROSCOPIC
Bilirubin Urine: NEGATIVE
Glucose, UA: NEGATIVE mg/dL
HGB URINE DIPSTICK: NEGATIVE
KETONES UR: NEGATIVE mg/dL
Leukocytes, UA: NEGATIVE
Nitrite: NEGATIVE
PROTEIN: NEGATIVE mg/dL
Specific Gravity, Urine: 1.01 (ref 1.005–1.030)
pH: 7 (ref 5.0–8.0)

## 2018-04-29 LAB — CBC
HEMATOCRIT: 39.6 % (ref 36.0–46.0)
HEMOGLOBIN: 13 g/dL (ref 12.0–15.0)
MCH: 30.2 pg (ref 26.0–34.0)
MCHC: 32.8 g/dL (ref 30.0–36.0)
MCV: 91.9 fL (ref 80.0–100.0)
Platelets: 226 10*3/uL (ref 150–400)
RBC: 4.31 MIL/uL (ref 3.87–5.11)
RDW: 13.7 % (ref 11.5–15.5)
WBC: 10.5 10*3/uL (ref 4.0–10.5)
nRBC: 0 % (ref 0.0–0.2)

## 2018-04-29 LAB — COMPREHENSIVE METABOLIC PANEL
ALK PHOS: 215 U/L — AB (ref 38–126)
ALT: 23 U/L (ref 0–44)
ANION GAP: 11 (ref 5–15)
AST: 23 U/L (ref 15–41)
Albumin: 3.4 g/dL — ABNORMAL LOW (ref 3.5–5.0)
BILIRUBIN TOTAL: 0.2 mg/dL — AB (ref 0.3–1.2)
BUN: 12 mg/dL (ref 6–20)
CALCIUM: 9.2 mg/dL (ref 8.9–10.3)
CO2: 20 mmol/L — ABNORMAL LOW (ref 22–32)
Chloride: 106 mmol/L (ref 98–111)
Creatinine, Ser: 0.71 mg/dL (ref 0.44–1.00)
GFR calc non Af Amer: >60 mL/min (ref >60)
Glucose, Bld: 89 mg/dL (ref 70–99)
Potassium: 3.6 mmol/L (ref 3.5–5.1)
Sodium: 137 mmol/L (ref 135–145)
TOTAL PROTEIN: 6.5 g/dL (ref 6.5–8.1)

## 2018-04-29 LAB — PROTEIN / CREATININE RATIO, URINE
Creatinine, Urine: 65 mg/dL
PROTEIN CREATININE RATIO: 0.11 mg/mg{creat} (ref 0.00–0.15)
TOTAL PROTEIN, URINE: 7 mg/dL

## 2018-04-29 NOTE — Discharge Instructions (Signed)
Hipertensin durante el embarazo (Hypertension During Pregnancy) La hipertensin, conocida comnmente como presin arterial alta, se produce cuando la sangre bombea en las arterias con mucha fuerza. Las arterias son vasos sanguneos que transportan la sangre desde el corazn al resto del cuerpo. La hipertensin durante el embarazo puede causar problemas para usted y el beb. Es posible que el beb no tenga el peso adecuado al nacer o puede que nazca antes de tiempo (prematuro). En los casos muy graves de hipertensin durante el embarazo puede estar en peligro la vida. Durante el embarazo se pueden presentar diferentes tipos de hipertensin arterial. Estos incluyen los siguientes:  Hipertensin crnica. Esto puede suceder en las siguientes ocasiones: ? Cuando una mujer sufre de hipertensin antes del embarazo y contina con hipertensin durante este. ? Cuando una mujer sufre de hipertensin arterial antes de las 20semanas de embarazo y contina durante todo el embarazo.  Hipertensin gestacional. Esta se desarrolla a partir de la semana 20de embarazo.  Preeclampsia, tambin se la conoce como toxemia del embarazo. Es un tipo muy grave de hipertensin que se desarrolla solo durante el embarazo. Afecta a todo el cuerpo y puede ser muy peligrosa para la madre y el beb. La hipertensin gestacional y preeclampsia por lo general, desaparecen en un perodo de 6semanas despus del nacimiento del beb. Las mujeres que sufren de hipertensin durante el embarazo tienen una mayor probabilidad de desarrollar hipertensin en etapas posteriores de la vida o en embarazos futuros. CAUSAS Se desconoce la causa exacta de la hipertensin. FACTORES DE RIESGO Existen ciertos factores que aumentan las probabilidades de que desarrolle hipertensin durante el embarazo. Estos incluyen los siguientes:  Haber sufrido hipertensin durante un embarazo anterior o antes del embarazo.  Tener sobrepeso.  Ser mayor de  40aos.  Estar embarazada por primera vez o de ms de un beb.  Embarazarse a travs de mtodos de fertilizacin como FIV (fertilizacin in vitro).  Tener diabetes, problemas renales o lupus eritematoso sistmico.  Tener antecedentes familiares de hipertensin. SNTOMAS La hipertensin crnica y gestacional en raras ocasiones provoca sntomas. La preeclampsia causa sntomas, que pueden incluir, entre otros:  Aumento de las protenas en la orina. El mdico la controlar en cada visita prenatal.  Dolor de cabeza intenso.  Aumento repentino de peso.  Hinchazn de las manos, del rostro, de las piernas y de los pies.  Nuseas y vmitos.  Problemas visuales, como visin doble o borrosa.  Adormecimiento del rostro, de los brazos, de las piernas y de los pies.  Mareos.  Hablar arrastrando las palabras.  Sensibilidad a las luces brillantes.  Dolor abdominal.  Espasmos. DIAGNSTICO Es posible que se le diagnostique hipertensin durante un control prenatal de rutina. En cada visita prenatal, es posible que le realicen los siguientes exmenes:  Un anlisis de orina para determinar si tiene grandes cantidades de protena en la orina.  Controle su presin arterial. La lectura de la presin arterial se registra con dos nmeros, por ejemplo "120 sobre 80" (o 120/80). El primer nmero ("superior") es la presin sistlica. Es la medida de la presin de las arterias cuando el corazn late. El segundo nmero ("inferior") es la presin diastlica. Es la medida de la presin en las arterias cuando el corazn se relaja entre latidos. La presin sangunea se mide en una unidad llamada mmHg. Una lectura normal de la presin arterial sera: ? Presin sistlica: por debajo de 120. ? Presin diastlica: por debajo de 80. El tipo de hipertensin que le diagnostiquen depender de los resultados de   las pruebas y de cundo se desarrollaron los sntomas.  La hipertensin crnica frecuentemente se  diagnostica antes de las 20semanas de embarazo.  La hipertensin gestacional frecuentemente se diagnostica despus de las 20semanas de embarazo.  La hipertensin con grandes cantidades de protenas en la orina se diagnostica como preeclampsia.  Las mediciones de la presin arterial de ms de 160sistlica o 110diastlica son un signo de preeclampsia grave. TRATAMIENTO El tratamiento para la hipertensin durante el embarazo difiere segn el tipo de hipertensin que tiene y de su gravedad.  Si toma medicamentos inhibidores de la enzima convertidora de la angiotensina (IECA) para tratar la hipertensin crnica, es posible que deba cambiar los medicamentos. Los medicamentos inhibidores de la enzima convertidora de la angiotensina (IECA) no deben tomarse durante el embarazo.  Si usted tiene hipertensin gestacional, tendr que tomar un medicamento para la presin arterial.  Su mdico puede recomendarle que tome una aspirina de dosis baja todos los das, a fin de ayudar a prevenir la hipertensin durante el embarazo, si est en riesgo de padecer preeclampsia.  Si tiene preeclampsia grave, es posible que deba hospitalizarse para que usted y el beb puedan ser controlados atentamente. Puede ser que necesite tomar medicamentos (sulfato de magnesio) para prevenir las convulsiones y reducir la presin arterial. El medicamento se puede administrar como una inyeccin o por va intravenosa (IV).  En ciertos casos, si su afeccin empeora, es posible que necesite dar a luz de forma ms temprana. INSTRUCCIONES PARA EL CUIDADO EN EL HOGAR Comida y bebida  Beba suficiente lquido para mantener la orina clara o de color amarillo plido.  Mantenga una dieta saludable con bajo contenido de sal (sodio). No agregue sal a las comidas. Verifique las etiquetas de los alimentos para conocer cunto sodio hay en una comida o una bebida. Estilo de vida  No consuma ningn producto que contenga nicotina o tabaco, como  cigarrillos y cigarrillos electrnicos. Si necesita ayuda para dejar de fumar, consulte al mdico.  No consuma alcohol.  Evite la cafena.  Evite las situaciones estresantes tanto como sea posible. Haga reposo y duerma bien. Instrucciones generales  Tome los medicamentos de venta libre y los recetados solamente como se lo haya indicado el mdico.  Acustese sobre el lado izquierdo mientras hace reposo. De este modo, se quita la presin del beb.  Eleve las piernas mientras est sentada o recostada. Intente colocar algunas almohadas debajo de las pantorrillas.  Haga ejercicios regularmente. Consulte al mdico qu tipos de ejercicios son seguros para usted.  Concurra a todas las visitas de control prenatales y de seguimiento como se lo haya indicado el mdico. Esto es importante. SOLICITE ATENCIN MDICA SI:  Tiene sntomas que su mdico le ha informado pueden requerir ms tratamiento o control, por ejemplo: ? Fiebre. ? Vmitos. ? Dolor de cabeza. SOLICITE ATENCIN MDICA DE INMEDIATO SI:  Tiene dolor abdominal intenso o vmitos que no mejoran con el tratamiento.  Presenta hinchazn repentina en las manos, los tobillos o el rostro.  Aumenta ms de 4libras (1,8kg) o ms en una semana.  Tiene hemorragia vaginal o tiene sangre en la orina.  No siente que el beb se mueva de forma habitual.  Tiene visin doble o borrosa.  Tiene calambres o espasmos musculares repentinos.  Le falta el aire.  Sus labios o uas comienzan a volverse azules. Esta informacin no tiene como fin reemplazar el consejo del mdico. Asegrese de hacerle al mdico cualquier pregunta que tenga. Document Released: 05/09/2011 Document Revised: 06/10/2014 Document Reviewed: 11/03/2015   Elsevier Interactive Patient Education  2018 [employer]vier Inc[employer]  

## 2018-04-29 NOTE — Progress Notes (Signed)
Megan Love is 27 y.o. G1P0 female at 5129w2d presenting for her scheduled OB follow up. At triage, BPs 122/96 and 124/91. No prior history of elevated BPs. Given GA and elevated diastolic pressures, patient sent to MAU for BP monitoring and Pre-E work up. Patient asymptomatic denying headaches, vision changes, RUQ pain. No SOB or edema. Discussed with patient that may warrant IOL if found to have new diagnosis of gHTN or Pre-Eclampsia. Informed MAU provider of patient and patient escorted to MAU by clinic RN.   Marcy Sirenatherine Gedalia Mcmillon, D.O. [facility] Family Medicine Fellow, [facility], [facility] 04/29/2018, 4:22 PM

## 2018-04-29 NOTE — MAU Provider Note (Signed)
  History   The history was obtained with assistance from a live Engineer, structuralpanish translator.  CSN: [id number]  Arrival date and time: 04/29/18 1629   First Provider Initiated Contact with Patient 04/29/18 1704      Chief Complaint  Patient presents with  . Hypertension   HPI  This is a 27 year old G1P0 at 1949w2d who presents from the office for evaluation of elevated BP. At triage this afternoon, BP's 122/96 and 124/91. She denies headache, vision changes, RUQ tenderness, SOB, edema. She denies vaginal bleeding or leakage of fluid. She endorses mild, irregular ctx. She reports positive fetal movement. She denies hx of elevated BP's. She was sent to the MAU for further monitoring and evaluation.    Past Medical History:  Diagnosis Date  . Medical history non-contributory     Past Surgical History:  Procedure Laterality Date  . NO PAST SURGERIES      No family history on file.  Social History   Tobacco Use  . Smoking status: Never Smoker  . Smokeless tobacco: Never Used  Substance Use Topics  . Alcohol use: Not Currently  . Drug use: Not Currently    Allergies: No Known Allergies  Medications Prior to Admission  Medication Sig Dispense Refill Last Dose  . Prenatal Vit-Fe Fumarate-FA (PRENATAL VITAMIN PO) Take by mouth.   Taking    Review of Systems  Gastrointestinal: Negative for abdominal pain, nausea and vomiting.  Genitourinary: Negative for dysuria, hematuria, pelvic pain, vaginal bleeding and vaginal discharge.   Physical Exam   Blood pressure 128/78, pulse 86, temperature 98.3 F (36.8 C), temperature source Oral, resp. rate 18, SpO2 100 %.  Physical Exam  Vitals reviewed. Constitutional: She is oriented to person, place, and time. She appears well-developed and well-nourished.  HENT:  Head: Normocephalic and atraumatic.  Cardiovascular: Normal rate and regular rhythm.  Respiratory: Effort normal and breath sounds normal.  GI: Soft. There is no tenderness.   FH appropriate for GA.  No RUQ tenderness.  Neurological: She is alert and oriented to person, place, and time.  Skin: Skin is warm and dry.  Psychiatric: She has a normal mood and affect. Her behavior is normal.    MAU Course  Procedures  MDM CBC CMP Protein creatinine ratio Urinalysis  Blood pressures in MAU have been stable <128/89.  Assessment and Plan  1. Transient elevated blood pressure during third trimester of pregnancy - Pt remains asx - BP's <128/89 in MAU - Labs within normal limits - Discharge home with plan to have patient return tomorrow for BP recheck  Cyris Maalouf, PA-S. 04/29/2018, 5:07 PM

## 2018-04-29 NOTE — MAU Provider Note (Signed)
History     CSN: [id number]  Arrival date and time: 04/29/18 1629   First Provider Initiated Contact with Patient 04/29/18 1704      Chief Complaint  Patient presents with  . Hypertension   HPI This is a 27 year old G1P0 at [redacted]w[redacted]d who presents from the office for evaluation of elevated BP. At triage this afternoon, BP's 122/96 and 124/91. She denies headache, vision changes, RUQ tenderness, SOB, edema. She denies vaginal bleeding or leakage of fluid. She endorses mild, irregular ctx. She reports positive fetal movement. She denies hx of elevated BP's. She was sent to the MAU for further monitoring and evaluation.   OB History    Gravida  1   Para      Term      Preterm      AB      Living        SAB      TAB      Ectopic      Multiple      Live Births              Past Medical History:  Diagnosis Date  . Medical history non-contributory     Past Surgical History:  Procedure Laterality Date  . NO PAST SURGERIES      No family history on file.  Social History   Tobacco Use  . Smoking status: Never Smoker  . Smokeless tobacco: Never Used  Substance Use Topics  . Alcohol use: Not Currently  . Drug use: Not Currently    Allergies: No Known Allergies  Medications Prior to Admission  Medication Sig Dispense Refill Last Dose  . Prenatal Vit-Fe Fumarate-FA (PRENATAL VITAMIN PO) Take by mouth.   Taking    Review of Systems  Constitutional: Negative.  Negative for fatigue and fever.  HENT: Negative.   Respiratory: Negative.  Negative for shortness of breath.   Cardiovascular: Negative.  Negative for chest pain.  Gastrointestinal: Negative.  Negative for abdominal pain, constipation, diarrhea, nausea and vomiting.  Genitourinary: Negative.  Negative for dysuria, vaginal bleeding and vaginal discharge.  Neurological: Negative.  Negative for dizziness and headaches.   Physical Exam   Blood pressure 119/76, pulse 85, temperature 98.3 F (36.8  C), temperature source Oral, resp. rate 18, SpO2 100 %.   Patient Vitals for the past 24 hrs:  BP Temp Temp src Pulse Resp SpO2  04/29/18 1746 119/76 - - 85 - -  04/29/18 1731 113/70 - - 81 - -  04/29/18 1716 113/83 - - 94 - -  04/29/18 1700 128/78 - - 86 - -  04/29/18 1640 126/89 98.3 F (36.8 C) Oral 72 18 100 %   Physical Exam  Nursing note and vitals reviewed. Constitutional: She is oriented to person, place, and time. She appears well-developed and well-nourished. No distress.  HENT:  Head: Normocephalic.  Eyes: Pupils are equal, round, and reactive to light.  Cardiovascular: Normal rate, regular rhythm and normal heart sounds.  Respiratory: Effort normal and breath sounds normal. No respiratory distress.  GI: Soft. Bowel sounds are normal. She exhibits no distension. There is no tenderness.  Neurological: She is alert and oriented to person, place, and time. She has normal reflexes. She displays normal reflexes. She exhibits normal muscle tone. Coordination normal.  Skin: Skin is warm and dry.  Psychiatric: She has a normal mood and affect. Her behavior is normal. Judgment and thought content normal.   Fetal Tracing:  Baseline: 130 Variability:  moderate Accels: 15x15 Decels: none  Toco: none  MAU Course  Procedures Results for orders placed or performed during the hospital encounter of 04/29/18 (from the past 24 hour(s))  CBC     Status: None   Collection Time: 04/29/18  4:55 PM  Result Value Ref Range   WBC 10.5 4.0 - 10.5 K/uL   RBC 4.31 3.87 - 5.11 MIL/uL   Hemoglobin 13.0 12.0 - 15.0 g/dL   HCT 29.539.6 62.136.0 - 30.846.0 %   MCV 91.9 80.0 - 100.0 fL   MCH 30.2 26.0 - 34.0 pg   MCHC 32.8 30.0 - 36.0 g/dL   RDW 65.713.7 84.611.5 - 96.215.5 %   Platelets 226 150 - 400 K/uL   nRBC 0.0 0.0 - 0.2 %  Comprehensive metabolic panel     Status: Abnormal   Collection Time: 04/29/18  4:55 PM  Result Value Ref Range   Sodium 137 135 - 145 mmol/L   Potassium 3.6 3.5 - 5.1 mmol/L    Chloride 106 98 - 111 mmol/L   CO2 20 (L) 22 - 32 mmol/L   Glucose, Bld 89 70 - 99 mg/dL   BUN 12 6 - 20 mg/dL   Creatinine, Ser 9.520.71 0.44 - 1.00 mg/dL   Calcium 9.2 8.9 - 84.110.3 mg/dL   Total Protein 6.5 6.5 - 8.1 g/dL   Albumin 3.4 (L) 3.5 - 5.0 g/dL   AST 23 15 - 41 U/L   ALT 23 0 - 44 U/L   Alkaline Phosphatase 215 (H) 38 - 126 U/L   Total Bilirubin 0.2 (L) 0.3 - 1.2 mg/dL   GFR calc non Af Amer >60 >60 mL/min   GFR calc Af Amer >60 >60 mL/min   Anion gap 11 5 - 15  Protein / creatinine ratio, urine     Status: None   Collection Time: 04/29/18  4:58 PM  Result Value Ref Range   Creatinine, Urine 65.00 mg/dL   Total Protein, Urine 7 mg/dL   Protein Creatinine Ratio 0.11 0.00 - 0.15 mg/mg[Cre]  Urinalysis, Routine w reflex microscopic     Status: None   Collection Time: 04/29/18  4:58 PM  Result Value Ref Range   Color, Urine YELLOW YELLOW   APPearance CLEAR CLEAR   Specific Gravity, Urine 1.010 1.005 - 1.030   pH 7.0 5.0 - 8.0   Glucose, UA NEGATIVE NEGATIVE mg/dL   Hgb urine dipstick NEGATIVE NEGATIVE   Bilirubin Urine NEGATIVE NEGATIVE   Ketones, ur NEGATIVE NEGATIVE mg/dL   Protein, ur NEGATIVE NEGATIVE mg/dL   Nitrite NEGATIVE NEGATIVE   Leukocytes, UA NEGATIVE NEGATIVE   MDM UA CBC, CMP, Protein/creat ratio  Assessment and Plan   1. Transient hypertension of pregnancy in third trimester   2. [redacted] weeks gestation of pregnancy    -Discharge home in stable condition -Preeclampsia precautions discussed -Patient advised to follow-up with MAU tomorrow for a BP check. Reviewed IOL for gHTN if elevated tomorrow.  -Patient may return to MAU as needed or if her condition were to change or worsen   Rolm BookbinderCaroline M Chinelo Benn CNM 04/29/2018, 6:13 PM

## 2018-04-29 NOTE — MAU Note (Signed)
Sent up from clinic with elevated BP.  States BP was elevated last week, though do not see it documented. Denies HA, visual changes, epigastric pain or swelling.

## 2018-04-30 ENCOUNTER — Inpatient Hospital Stay (HOSPITAL_COMMUNITY)
Admission: AD | Admit: 2018-04-30 | Discharge: 2018-04-30 | Disposition: A | Discharge status: Home / Self Care | Payer: Self-pay | Source: Ambulatory Visit | Attending: Obstetrics and Gynecology | Admitting: Obstetrics and Gynecology

## 2018-04-30 DIAGNOSIS — Z3480 Encounter for supervision of other normal pregnancy, unspecified trimester: Secondary | ICD-10-CM

## 2018-04-30 DIAGNOSIS — O2343 Unspecified infection of urinary tract in pregnancy, third trimester: Secondary | ICD-10-CM

## 2018-04-30 DIAGNOSIS — R030 Elevated blood-pressure reading, without diagnosis of hypertension: Secondary | ICD-10-CM | POA: Insufficient documentation

## 2018-04-30 NOTE — MAU Note (Signed)
Pt here for b/p check. Denies headache, blurred vision, upper abd pain. Reports good fetal movement.

## 2018-04-30 NOTE — MAU Provider Note (Signed)
Chief Complaint: Follow-up   First Provider Initiated Contact with Patient 04/30/18 0925      SUBJECTIVE HPI: Ms. Megan Love is a 27 y.o. G1P0 who presents to maternity admissions for blood pressure check. She was here on 04/29/2018 for elevated BP. She denies H/A, blurry vision, RUQ pain, VB, or LOF.   Past Medical History:  Diagnosis Date  . Medical history non-contributory    Past Surgical History:  Procedure Laterality Date  . NO PAST SURGERIES     Social History   Socioeconomic History  . Marital status: Significant Other    Spouse name: Not on file  . Number of children: Not on file  . Years of education: Not on file  . Highest education level: Not on file  Occupational History  . Not on file  Social Needs  . Financial resource strain: Not on file  . Food insecurity:    Worry: Not on file    Inability: Not on file  . Transportation needs:    Medical: Not on file    Non-medical: Not on file  Tobacco Use  . Smoking status: Never Smoker  . Smokeless tobacco: Never Used  Substance and Sexual Activity  . Alcohol use: Not Currently  . Drug use: Not Currently  . Sexual activity: Not on file  Lifestyle  . Physical activity:    Days per week: Not on file    Minutes per session: Not on file  . Stress: Not on file  Relationships  . Social connections:    Talks on phone: Not on file    Gets together: Not on file    Attends religious service: Not on file    Active member of club or organization: Not on file    Attends meetings of clubs or organizations: Not on file    Relationship status: Not on file  . Intimate partner violence:    Fear of current or ex partner: Not on file    Emotionally abused: Not on file    Physically abused: Not on file    Forced sexual activity: Not on file  Other Topics Concern  . Not on file  Social History Narrative  . Not on file   No current facility-administered medications on file prior to encounter.     Current Outpatient Medications on File Prior to Encounter  Medication Sig Dispense Refill  . Prenatal Vit-Fe Fumarate-FA (PRENATAL VITAMIN PO) Take by mouth.     No Known Allergies  ROS:  Review of Systems  Constitutional: Negative.   HENT: Negative.   Eyes: Negative.   Respiratory: Negative.   Cardiovascular: Negative.   Gastrointestinal: Negative.   Endocrine: Negative.   Genitourinary: Negative.   Musculoskeletal: Negative.   Skin: Negative.   Allergic/Immunologic: Negative.   Neurological: Negative.   Hematological: Negative.   Psychiatric/Behavioral: Negative.     I have reviewed patient's Past Medical Hx, Surgical Hx, Family Hx, Social Hx, medications and allergies.   Physical Exam   Patient Vitals for the past 24 hrs:  BP Temp Temp src Pulse Resp SpO2  04/30/18 0857 116/75 97.9 F (36.6 C) Oral 80 16 100 %   Physical Exam  Vitals reviewed. Constitutional: She is oriented to person, place, and time. She appears well-developed and well-nourished.  HENT:  Head: Normocephalic and atraumatic.  Eyes: Pupils are equal, round, and reactive to light.  Neck: Normal range of motion.  Cardiovascular: Normal rate.  Respiratory: Effort normal.  GI: Soft.  Genitourinary:  Genitourinary Comments: deferred  Musculoskeletal: Normal range of motion.  Neurological: She is alert and oriented to person, place, and time. She has normal reflexes.  Skin: Skin is warm and dry.  Psychiatric: She has a normal mood and affect. Her behavior is normal. Judgment and thought content normal.    MDM Patient denies any concerning symptoms for PEC. S/Sx's pf PEC reviewed once again. Patient did not have any questions.  FHTs by doppler: 135 bpm  ASSESSMENT MSE Complete VS WNL  PLAN Discharge patient Keep scheduled appt with [facility] Patient verbalized an understanding of the plan of care and agrees.    Raelyn Mora, CNM 04/30/2018 9:25 AM

## 2018-05-06 ENCOUNTER — Ambulatory Visit (INDEPENDENT_AMBULATORY_CARE_PROVIDER_SITE_OTHER): Payer: Self-pay | Admitting: Nurse Practitioner

## 2018-05-06 VITALS — BP 122/81 | HR 80 | Wt 158.8 lb

## 2018-05-06 DIAGNOSIS — Z3480 Encounter for supervision of other normal pregnancy, unspecified trimester: Secondary | ICD-10-CM

## 2018-05-06 DIAGNOSIS — Z3A40 40 weeks gestation of pregnancy: Secondary | ICD-10-CM

## 2018-05-06 DIAGNOSIS — Z3483 Encounter for supervision of other normal pregnancy, third trimester: Secondary | ICD-10-CM

## 2018-05-06 NOTE — Progress Notes (Signed)
    Subjective:  Megan Love is a 27 y.o. G1P0 at 1466w2d being seen today for ongoing prenatal care.  She is currently monitored for the following issues for this low-risk pregnancy and has Supervision of other normal pregnancy, antepartum and UTI in pregnancy, antepartum, third trimester on their problem list.  Patient reports no complaints.  Contractions: Irregular. Vag. Bleeding: None.  Movement: Present. Denies leaking of fluid.   The following portions of the patient's history were reviewed and updated as appropriate: allergies, current medications, past family history, past medical history, past social history, past surgical history and problem list. Problem list updated.  Objective:   Vitals:   05/06/18 1315  BP: 122/81  Pulse: 80  Weight: 158 lb 12.8 oz (72 kg)    Fetal Status: Fetal Heart Rate (bpm): 131 Fundal Height: 38 cm Movement: Present  Presentation: Vertex  General:  Alert, oriented and cooperative. Patient is in no acute distress.  Skin: Skin is warm and dry. No rash noted.   Cardiovascular: Normal heart rate noted  Respiratory: Normal respiratory effort, no problems with respiration noted  Abdomen: Soft, gravid, appropriate for gestational age. Pain/Pressure: Present     Pelvic:  Cervical exam performed Dilation: 1 Effacement (%): Thick Station: -2  Extremities: Normal range of motion.  Edema: None  Mental Status: Normal mood and affect. Normal behavior. Normal judgment and thought content.   Urinalysis:      Assessment and Plan:  Pregnancy: G1P0 at 7766w2d  1. Supervision of other normal pregnancy, antepartum Reports appropriate movement. Induction on 05-11-18 at 6:30 am Advised to come to MAU on 05-10-18 at 7 pm for foley bulb  Term labor symptoms and general obstetric precautions including but not limited to vaginal bleeding, contractions, leaking of fluid and fetal movement were reviewed in detail with the patient. Please refer to After  Visit Summary for other counseling recommendations.  No follow-ups on file.  Will have induction.  Megan BernheimERRI Latavia Goga, RN, MSN, NP-BC Nurse Practitioner, [facility], [facility] 05/07/2018 8:32 PM

## 2018-05-06 NOTE — Patient Instructions (Signed)
  Come to MAU on Sunday evening at 7 pm on December 8 for Foley bulb.    OUTPATIENT FOLEY BULB INDUCTION OF LABOR:  Information Sheet for Mothers and Family               What's a Foley Bulb Induction? A Foley bulb induction is a procedure where your provider inserts a catheter into your cervix. Once inside your womb, your provider inflates the balloon with a saline solution.   This puts pressure on your cervix and encourages dilation. The catheter falls out once your cervix dilates to 3-4 centimeters.     With any procedure, it's important that you know what to expect. The insertion of a Foley catheter can be a bit uncomfortable, and some women experience sharp pelvic pain. The pain may subside once the catheter is in place. You may experience some cramping when the Foley catheter is in place.  This is normal.     GO TO THE MATERNITY ADMISSIONS UNIT FOR THE FOLLOWING:  Heavy vaginal bleeding  Rupture of membranes (fluid that wets your underwear)  Painful uterine contractions every 5 minutes or less  Severe abdominal discomfort  Decreased movement of the baby

## 2018-05-07 ENCOUNTER — Telehealth (HOSPITAL_COMMUNITY): Payer: Self-pay | Admitting: *Deleted

## 2018-05-07 ENCOUNTER — Inpatient Hospital Stay (HOSPITAL_COMMUNITY)
Admission: AD | Admit: 2018-05-07 | Discharge: 2018-05-10 | DRG: 807 | Disposition: A | Discharge status: Home / Self Care | Payer: Medicaid Other | Attending: Obstetrics & Gynecology | Admitting: Obstetrics & Gynecology

## 2018-05-07 ENCOUNTER — Encounter (HOSPITAL_COMMUNITY): Payer: Self-pay | Admitting: *Deleted

## 2018-05-07 DIAGNOSIS — Z3A40 40 weeks gestation of pregnancy: Secondary | ICD-10-CM

## 2018-05-07 DIAGNOSIS — O2343 Unspecified infection of urinary tract in pregnancy, third trimester: Secondary | ICD-10-CM

## 2018-05-07 DIAGNOSIS — O480 Post-term pregnancy: Secondary | ICD-10-CM | POA: Diagnosis present

## 2018-05-07 DIAGNOSIS — O134 Gestational [pregnancy-induced] hypertension without significant proteinuria, complicating childbirth: Secondary | ICD-10-CM | POA: Diagnosis present

## 2018-05-07 DIAGNOSIS — Z3480 Encounter for supervision of other normal pregnancy, unspecified trimester: Secondary | ICD-10-CM

## 2018-05-07 DIAGNOSIS — O133 Gestational [pregnancy-induced] hypertension without significant proteinuria, third trimester: Secondary | ICD-10-CM

## 2018-05-07 LAB — CBC
HCT: 38.3 % (ref 36.0–46.0)
Hemoglobin: 12.7 g/dL (ref 12.0–15.0)
MCH: 30.3 pg (ref 26.0–34.0)
MCHC: 33.2 g/dL (ref 30.0–36.0)
MCV: 91.4 fL (ref 80.0–100.0)
Platelets: 216 10*3/uL (ref 150–400)
RBC: 4.19 MIL/uL (ref 3.87–5.11)
RDW: 14.2 % (ref 11.5–15.5)
WBC: 7.9 10*3/uL (ref 4.0–10.5)
nRBC: 0 % (ref 0.0–0.2)

## 2018-05-07 LAB — COMPREHENSIVE METABOLIC PANEL
ALT: 24 U/L (ref 0–44)
AST: 29 U/L (ref 15–41)
Albumin: 3.3 g/dL — ABNORMAL LOW (ref 3.5–5.0)
Alkaline Phosphatase: 212 U/L — ABNORMAL HIGH (ref 38–126)
Anion gap: 9 (ref 5–15)
BUN: 11 mg/dL (ref 6–20)
CO2: 19 mmol/L — ABNORMAL LOW (ref 22–32)
Calcium: 9 mg/dL (ref 8.9–10.3)
Chloride: 108 mmol/L (ref 98–111)
Creatinine, Ser: 0.65 mg/dL (ref 0.44–1.00)
GFR calc Af Amer: >60 mL/min (ref >60)
GFR calc non Af Amer: >60 mL/min (ref >60)
Glucose, Bld: 89 mg/dL (ref 70–99)
Potassium: 3.9 mmol/L (ref 3.5–5.1)
SODIUM: 136 mmol/L (ref 135–145)
Total Bilirubin: 0.5 mg/dL (ref 0.3–1.2)
Total Protein: 7 g/dL (ref 6.5–8.1)

## 2018-05-07 LAB — PROTEIN / CREATININE RATIO, URINE
Creatinine, Urine: 66 mg/dL
Protein Creatinine Ratio: 0.11 mg/mg{Cre} (ref 0.00–0.15)
Total Protein, Urine: 7 mg/dL

## 2018-05-07 LAB — TYPE AND SCREEN
ABO/RH(D): O POS
ANTIBODY SCREEN: NEGATIVE

## 2018-05-07 MED ORDER — LACTATED RINGERS IV SOLN: 500.0000 mL | INTRAVENOUS | Status: DC | PRN

## 2018-05-07 MED ORDER — HYDROXYZINE HCL 50 MG PO TABS
50.0000 mg | ORAL_TABLET | Freq: Four times a day (QID) | ORAL | Status: DC | PRN
  Filled 2018-05-07: qty 1

## 2018-05-07 MED ORDER — LACTATED RINGERS IV SOLN
INTRAVENOUS | Status: DC
  Administered 2018-05-07: 22:00:00 via INTRAVENOUS

## 2018-05-07 MED ORDER — LIDOCAINE HCL (PF) 1 % IJ SOLN
30.0000 mL | INTRAMUSCULAR | Status: DC | PRN
  Filled 2018-05-07: qty 30

## 2018-05-07 MED ORDER — FLEET ENEMA 7-19 GM/118ML RE ENEM: 1.0000 | ENEMA | RECTAL | Status: DC | PRN

## 2018-05-07 MED ORDER — TERBUTALINE SULFATE 1 MG/ML IJ SOLN
0.2500 mg | Freq: Once | INTRAMUSCULAR | Status: DC | PRN
  Filled 2018-05-07: qty 1

## 2018-05-07 MED ORDER — FENTANYL CITRATE (PF) 100 MCG/2ML IJ SOLN
50.0000 ug | INTRAMUSCULAR | Status: DC | PRN
  Administered 2018-05-08 (×2): 100 ug via INTRAVENOUS
  Filled 2018-05-07 (×2): qty 2

## 2018-05-07 MED ORDER — MISOPROSTOL 25 MCG QUARTER TABLET
25.0000 ug | ORAL_TABLET | ORAL | Status: DC
  Filled 2018-05-07 (×6): qty 1

## 2018-05-07 MED ORDER — OXYCODONE-ACETAMINOPHEN 5-325 MG PO TABS: 1.0000 | ORAL_TABLET | ORAL | Status: DC | PRN

## 2018-05-07 MED ORDER — ACETAMINOPHEN 325 MG PO TABS: 650.0000 mg | ORAL_TABLET | ORAL | Status: DC | PRN

## 2018-05-07 MED ORDER — ONDANSETRON HCL 4 MG/2ML IJ SOLN
4.0000 mg | Freq: Four times a day (QID) | INTRAMUSCULAR | Status: DC | PRN
  Administered 2018-05-08: 4 mg via INTRAVENOUS
  Filled 2018-05-07: qty 2

## 2018-05-07 MED ORDER — ONDANSETRON HCL 4 MG/2ML IJ SOLN: 4.0000 mg | Freq: Four times a day (QID) | INTRAMUSCULAR | Status: DC | PRN

## 2018-05-07 MED ORDER — OXYTOCIN BOLUS FROM INFUSION
500.0000 mL | Freq: Once | INTRAVENOUS | Status: DC
  Administered 2018-05-08: 500 mL via INTRAVENOUS

## 2018-05-07 MED ORDER — LACTATED RINGERS IV SOLN
INTRAVENOUS | Status: DC
  Administered 2018-05-07 – 2018-05-08 (×3): via INTRAVENOUS

## 2018-05-07 MED ORDER — OXYCODONE-ACETAMINOPHEN 5-325 MG PO TABS: 2.0000 | ORAL_TABLET | ORAL | Status: DC | PRN

## 2018-05-07 MED ORDER — OXYTOCIN 40 UNITS IN LACTATED RINGERS INFUSION - SIMPLE MED: 2.5000 [IU]/h | INTRAVENOUS | Status: DC

## 2018-05-07 MED ORDER — SOD CITRATE-CITRIC ACID 500-334 MG/5ML PO SOLN: 30.0000 mL | ORAL | Status: DC | PRN

## 2018-05-07 MED ORDER — OXYTOCIN BOLUS FROM INFUSION: 500.0000 mL | Freq: Once | INTRAVENOUS | Status: DC

## 2018-05-07 MED ORDER — LIDOCAINE HCL (PF) 1 % IJ SOLN: 30.0000 mL | INTRAMUSCULAR | Status: DC | PRN

## 2018-05-07 MED ORDER — OXYTOCIN 40 UNITS IN LACTATED RINGERS INFUSION - SIMPLE MED
2.5000 [IU]/h | INTRAVENOUS | Status: DC
  Administered 2018-05-08: 2.5 [IU]/h via INTRAVENOUS

## 2018-05-07 NOTE — Telephone Encounter (Signed)
[phone] interpreter number Preadmission screen

## 2018-05-07 NOTE — H&P (Signed)
Megan Love is a 27 y.o. female presenting for labor evaluation,  UCs since 1800. Some bleeding. Noted to have hypertension on eval today.  It is also noted that she had BP elevations on 04/29/18 Scheduled for IOL in 3 days. . OB History    Gravida  1   Para      Term      Preterm      AB      Living        SAB      TAB      Ectopic      Multiple      Live Births             Past Medical History:  Diagnosis Date  . Medical history non-contributory    Past Surgical History:  Procedure Laterality Date  . NO PAST SURGERIES     Family History: family history is not on file. Social History:  reports that she has never smoked. She has never used smokeless tobacco. She reports that she drank alcohol. She reports that she has current or past drug history.     Maternal Diabetes: No Genetic Screening: Declined Maternal Ultrasounds/Referrals: Normal Fetal Ultrasounds or other Referrals:  None Maternal Substance Abuse:  No Significant Maternal Medications:  None Significant Maternal Lab Results:  Lab values include: Group B Strep negative Other Comments:  New Gestational Hypertension  Review of Systems  Constitutional: Negative for chills and fever.  Eyes: Positive for blurred vision.  Gastrointestinal: Positive for abdominal pain. Negative for constipation, diarrhea, nausea and vomiting.  Neurological: Positive for headaches. Negative for focal weakness.   Maternal Medical History:  Reason for admission: Contractions.  Nausea.  Contractions: Onset was 3-5 hours ago.   Frequency: irregular.   Perceived severity is mild.    Fetal activity: Perceived fetal activity is normal.   Last perceived fetal movement was within the past hour.    Prenatal complications: Bleeding and PIH.   No placental abnormality, pre-eclampsia (but has gestational hypertension now) or preterm labor.   Prenatal Complications - Diabetes: none.      Blood  pressure (!) 123/93, pulse 76, temperature 98.8 F (37.1 C), resp. rate 19, height 5\' 3"  (1.6 m), weight 73 kg, SpO2 99 %. Maternal Exam:  Uterine Assessment: Contraction strength is mild.  Contraction frequency is irregular.   Abdomen: Patient reports no abdominal tenderness. Estimated fetal weight is 6.5.   Fetal presentation: vertex  Introitus: Normal vulva. Normal vagina.  Ferning test: not done.  Nitrazine test: not done. Amniotic fluid character: not assessed.  Pelvis: adequate for delivery.   Cervix: Cervix evaluated by digital exam.     Fetal Exam Fetal Monitor Review: Mode: ultrasound.   Baseline rate: 140.  Variability: moderate (6-25 bpm).   Pattern: no decelerations and accelerations present.    Fetal State Assessment: Category I - tracings are normal.     Physical Exam  Constitutional: She is oriented to person, place, and time. She appears well-developed and well-nourished. No distress.  HENT:  Head: Normocephalic.  Cardiovascular: Normal rate.  Respiratory: Effort normal. No respiratory distress.  GI: Soft. She exhibits no distension. There is no tenderness. There is no rebound and no guarding.  Genitourinary:  Genitourinary Comments: Dilation: 2 Effacement (%): 70, 80 Cervical Position: Middle Station: -1 Presentation: Vertex Exam by:: Ulonda Klosowski CNM   Musculoskeletal: Normal range of motion.  Neurological: She is alert and oriented to person, place, and time.  Skin: Skin is  warm and dry.  Psychiatric: She has a normal mood and affect.    Prenatal labs: ABO, Rh: O/Positive/-- (10/02 1428) Antibody: Negative (10/02 1428) Rubella: 2.76 (10/02 1428) RPR: Non Reactive (10/02 1428)  HBsAg: Negative (10/02 1428)  HIV: Non Reactive (10/02 1428)  GBS: Negative (11/06 0000)   Assessment/Plan: Single intrauterine pregnancy at [redacted]w[redacted]d Latent labor Gestational Hypertension without preeclampsia  Admit to [facility] Routine orders Induction of  labor Labor team to follow   Wynelle Bourgeois 05/07/2018, 9:49 PM

## 2018-05-07 NOTE — Progress Notes (Signed)
Patient Vitals for the past 4 hrs:  BP Temp Temp src Pulse Resp SpO2 Height Weight  05/07/18 2230 (!) 130/91 - - 77 18 - 5\' 4"  (1.626 m) 77.6 kg  05/07/18 2204 - 97.9 F (36.6 C) Oral - - - - -  05/07/18 2145 (!) 135/95 - - 79 - - - -  05/07/18 2130 (!) 123/93 - - 76 - - - -  05/07/18 2115 118/82 - - 66 - - - -  05/07/18 2100 125/86 - - 60 - - - -  05/07/18 2045 121/80 - - 61 - - - -  05/07/18 2040 (!) 128/95 - - 87 - - - -  05/07/18 2023 (!) 125/91 98.8 F (37.1 C) - 81 19 99 % 5\' 3"  (1.6 m) 73 kg   Foley inserted and inflated w/80cc H20.  Cx 2/50/-2/posterior/soft.  Ctx q 2-3 minutes, mild. FHR Cat 1.

## 2018-05-07 NOTE — MAU Note (Signed)
CTX since 1800-now 10 minutes apart.  No LOF.  Some bloody discharge.  +FM.  No complications with the pregnancy.  Induction for 12/9.

## 2018-05-08 ENCOUNTER — Inpatient Hospital Stay (HOSPITAL_COMMUNITY): Payer: Medicaid Other | Admitting: Anesthesiology

## 2018-05-08 ENCOUNTER — Other Ambulatory Visit: Payer: Self-pay

## 2018-05-08 ENCOUNTER — Encounter (HOSPITAL_COMMUNITY): Payer: Self-pay

## 2018-05-08 DIAGNOSIS — O134 Gestational [pregnancy-induced] hypertension without significant proteinuria, complicating childbirth: Secondary | ICD-10-CM

## 2018-05-08 DIAGNOSIS — O480 Post-term pregnancy: Secondary | ICD-10-CM

## 2018-05-08 DIAGNOSIS — Z3A40 40 weeks gestation of pregnancy: Secondary | ICD-10-CM

## 2018-05-08 LAB — CBC
HCT: 38 % (ref 36.0–46.0)
Hemoglobin: 13 g/dL (ref 12.0–15.0)
MCH: 31.3 pg (ref 26.0–34.0)
MCHC: 34.2 g/dL (ref 30.0–36.0)
MCV: 91.3 fL (ref 80.0–100.0)
Platelets: 215 10*3/uL (ref 150–400)
RBC: 4.16 MIL/uL (ref 3.87–5.11)
RDW: 14.4 % (ref 11.5–15.5)
WBC: 15.2 10*3/uL — ABNORMAL HIGH (ref 4.0–10.5)
nRBC: 0 % (ref 0.0–0.2)

## 2018-05-08 LAB — ABO/RH: ABO/RH(D): O POS

## 2018-05-08 LAB — RPR: RPR Ser Ql: NONREACTIVE

## 2018-05-08 MED ORDER — ONDANSETRON HCL 4 MG/2ML IJ SOLN: 4.0000 mg | INTRAMUSCULAR | Status: DC | PRN

## 2018-05-08 MED ORDER — LACTATED RINGERS IV SOLN
500.0000 mL | Freq: Once | INTRAVENOUS | Status: AC
  Administered 2018-05-08: 500 mL via INTRAVENOUS

## 2018-05-08 MED ORDER — LIDOCAINE HCL (PF) 1 % IJ SOLN
INTRAMUSCULAR | Status: DC | PRN
  Administered 2018-05-08 (×2): 5 mL via EPIDURAL

## 2018-05-08 MED ORDER — FENTANYL 2.5 MCG/ML BUPIVACAINE 1/10 % EPIDURAL INFUSION (WH - ANES)
14.0000 mL/h | INTRAMUSCULAR | Status: DC | PRN
  Administered 2018-05-08 (×2): 14 mL/h via EPIDURAL
  Filled 2018-05-08: qty 100

## 2018-05-08 MED ORDER — PRENATAL MULTIVITAMIN CH
1.0000 | ORAL_TABLET | Freq: Every day | ORAL | Status: DC
  Administered 2018-05-09 – 2018-05-10 (×2): 1 via ORAL
  Filled 2018-05-08 (×2): qty 1

## 2018-05-08 MED ORDER — WITCH HAZEL-GLYCERIN EX PADS: 1.0000 "application " | MEDICATED_PAD | CUTANEOUS | Status: DC | PRN

## 2018-05-08 MED ORDER — ZOLPIDEM TARTRATE 5 MG PO TABS: 5.0000 mg | ORAL_TABLET | Freq: Every evening | ORAL | Status: DC | PRN

## 2018-05-08 MED ORDER — PHENYLEPHRINE 40 MCG/ML (10ML) SYRINGE FOR IV PUSH (FOR BLOOD PRESSURE SUPPORT)
PREFILLED_SYRINGE | INTRAVENOUS | Status: AC
  Filled 2018-05-08: qty 10

## 2018-05-08 MED ORDER — DIPHENHYDRAMINE HCL 50 MG/ML IJ SOLN: 12.5000 mg | INTRAMUSCULAR | Status: DC | PRN

## 2018-05-08 MED ORDER — SIMETHICONE 80 MG PO CHEW: 80.0000 mg | CHEWABLE_TABLET | ORAL | Status: DC | PRN

## 2018-05-08 MED ORDER — DIBUCAINE 1 % RE OINT: 1.0000 "application " | TOPICAL_OINTMENT | RECTAL | Status: DC | PRN

## 2018-05-08 MED ORDER — DIPHENHYDRAMINE HCL 25 MG PO CAPS: 25.0000 mg | ORAL_CAPSULE | Freq: Four times a day (QID) | ORAL | Status: DC | PRN

## 2018-05-08 MED ORDER — PHENYLEPHRINE 40 MCG/ML (10ML) SYRINGE FOR IV PUSH (FOR BLOOD PRESSURE SUPPORT)
80.0000 ug | PREFILLED_SYRINGE | INTRAVENOUS | Status: DC | PRN
  Filled 2018-05-08: qty 10

## 2018-05-08 MED ORDER — PHENYLEPHRINE 40 MCG/ML (10ML) SYRINGE FOR IV PUSH (FOR BLOOD PRESSURE SUPPORT)
80.0000 ug | PREFILLED_SYRINGE | INTRAVENOUS | Status: DC | PRN
  Administered 2018-05-08: 80 ug via INTRAVENOUS
  Filled 2018-05-08: qty 10

## 2018-05-08 MED ORDER — BENZOCAINE-MENTHOL 20-0.5 % EX AERO: 1.0000 "application " | INHALATION_SPRAY | CUTANEOUS | Status: DC | PRN

## 2018-05-08 MED ORDER — EPHEDRINE 5 MG/ML INJ
10.0000 mg | INTRAVENOUS | Status: DC | PRN
  Filled 2018-05-08: qty 2

## 2018-05-08 MED ORDER — OXYTOCIN 40 UNITS IN LACTATED RINGERS INFUSION - SIMPLE MED
1.0000 m[IU]/min | INTRAVENOUS | Status: DC
  Administered 2018-05-08: 2 m[IU]/min via INTRAVENOUS
  Filled 2018-05-08: qty 1000

## 2018-05-08 MED ORDER — COCONUT OIL OIL: 1.0000 "application " | TOPICAL_OIL | Status: DC | PRN

## 2018-05-08 MED ORDER — IBUPROFEN 600 MG PO TABS
600.0000 mg | ORAL_TABLET | Freq: Four times a day (QID) | ORAL | Status: DC
  Administered 2018-05-09 – 2018-05-10 (×7): 600 mg via ORAL
  Filled 2018-05-08 (×7): qty 1

## 2018-05-08 MED ORDER — OXYCODONE HCL 5 MG PO TABS: 5.0000 mg | ORAL_TABLET | ORAL | Status: DC | PRN

## 2018-05-08 MED ORDER — TERBUTALINE SULFATE 1 MG/ML IJ SOLN
0.2500 mg | Freq: Once | INTRAMUSCULAR | Status: DC | PRN
  Filled 2018-05-08: qty 1

## 2018-05-08 MED ORDER — ONDANSETRON HCL 4 MG PO TABS: 4.0000 mg | ORAL_TABLET | ORAL | Status: DC | PRN

## 2018-05-08 MED ORDER — TETANUS-DIPHTH-ACELL PERTUSSIS 5-2.5-18.5 LF-MCG/0.5 IM SUSP: 0.5000 mL | Freq: Once | INTRAMUSCULAR | Status: DC

## 2018-05-08 MED ORDER — ACETAMINOPHEN 325 MG PO TABS: 650.0000 mg | ORAL_TABLET | ORAL | Status: DC | PRN

## 2018-05-08 MED ORDER — SENNOSIDES-DOCUSATE SODIUM 8.6-50 MG PO TABS
2.0000 | ORAL_TABLET | ORAL | Status: DC
  Administered 2018-05-09 (×2): 2 via ORAL
  Filled 2018-05-08 (×2): qty 2

## 2018-05-08 MED ORDER — FENTANYL 2.5 MCG/ML BUPIVACAINE 1/10 % EPIDURAL INFUSION (WH - ANES)
INTRAMUSCULAR | Status: AC
  Filled 2018-05-08: qty 100

## 2018-05-08 NOTE — Anesthesia Pain Management Evaluation Note (Signed)
  CRNA Pain Management Visit Note  Patient: Megan Love, 27 y.o., female  "Hello I am a member of the anesthesia team at [facility]. We have an anesthesia team available at all times to provide care throughout the hospital, including epidural management and anesthesia for C-section. I don't know your plan for the delivery whether it a natural birth, water birth, IV sedation, nitrous supplementation, doula or epidural, but we want to meet your pain goals."   1.Was your pain managed to your expectations on prior hospitalizations?   epidural  2.What is your expectation for pain management during this hospitalization?     Epidural  3.How can we help you reach that goal? Epidural in place at time of visit  Record the patient's initial score and the patient's pain goal.   Pain: 0  Pain Goal: 5 [facility] wants you to be able to say your pain was always managed very well.  Rica RecordsICKELTON,Arfa Lamarca 05/08/2018

## 2018-05-08 NOTE — Progress Notes (Addendum)
LABOR PROGRESS NOTE  Megan Love is a 27 y.o. G1P0 at 1328w4d  admitted for IOL 2/2 htn prior to scheduled induction.  Objective: BP (!) 142/76   Pulse 72   Temp 97.9 F (36.6 C) (Oral)   Resp 20   Ht 5\' 4"  (1.626 m)   Wt 77.6 kg   SpO2 99%   BMI 29.35 kg/m  or  Vitals:   05/07/18 2334 05/08/18 0001 05/08/18 0031 05/08/18 0157  BP: 133/90 128/88 128/85 (!) 142/76  Pulse: 73 82 (!) 59 72  Resp: 18 18  20   Temp:      TempSrc:      SpO2:      Weight:      Height:        ~2240 Dilation: 2 Effacement (%): 70, 80 Cervical Position: Posterior Station: -1 Presentation: Vertex Exam by:: Megan Love CNM  FHT: baseline rate 125, moderate varibility, positive acel, negative decel Toco: 2-3 minutes, mild  Labs: Lab Results  Component Value Date   WBC 7.9 05/07/2018   HGB 12.7 05/07/2018   HCT 38.3 05/07/2018   MCV 91.4 05/07/2018   PLT 216 05/07/2018    Patient Active Problem List   Diagnosis Date Noted  . Post-dates pregnancy 05/07/2018  . UTI in pregnancy, antepartum, third trimester 03/19/2018  . Supervision of other normal pregnancy, antepartum 03/06/2018    Assessment / Plan: 27 y.o. G1P0 at 328w4d here for IOL 2/2 elevated BP  Labor: FB placed 2240, otherwise expectant management at this time. Fetal Wellbeing:  Category I Pain Control:  Planning for epidural Anticipated MOD:  vaginal  Megan Anderson DO  05/08/2018, 4:01 AM   Megan Love

## 2018-05-08 NOTE — Anesthesia Preprocedure Evaluation (Signed)

## 2018-05-08 NOTE — Anesthesia Procedure Notes (Signed)
Epidural Patient location during procedure: OB  Staffing Anesthesiologist: Marialice Newkirk, MD Performed: anesthesiologist   Preanesthetic Checklist Completed: patient identified, site marked, surgical consent, pre-op evaluation, timeout performed, IV checked, risks and benefits discussed and monitors and equipment checked  Epidural Patient position: sitting Prep: DuraPrep Patient monitoring: heart rate, continuous pulse ox and blood pressure Approach: right paramedian Location: L3-L4 Injection technique: LOR saline  Needle:  Needle type: Tuohy  Needle gauge: 17 G Needle length: 9 cm and 9 Needle insertion depth: 6 cm Catheter type: closed end flexible Catheter size: 20 Guage Catheter at skin depth: 10 cm Test dose: negative  Assessment Events: blood not aspirated, injection not painful, no injection resistance, negative IV test and no paresthesia  Additional Notes Patient identified. Risks/Benefits/Options discussed with patient including but not limited to bleeding, infection, nerve damage, paralysis, failed block, incomplete pain control, headache, blood pressure changes, nausea, vomiting, reactions to medication both or allergic, itching and postpartum back pain. Confirmed with bedside nurse the patient's most recent platelet count. Confirmed with patient that they are not currently taking any anticoagulation, have any bleeding history or any family history of bleeding disorders. Patient expressed understanding and wished to proceed. All questions were answered. Sterile technique was used throughout the entire procedure. Please see nursing notes for vital signs. Test dose was given through epidural needle and negative prior to continuing to dose epidural or start infusion. Warning signs of high block given to the patient including shortness of breath, tingling/numbness in hands, complete motor block, or any concerning symptoms with instructions to call for help. Patient was given  instructions on fall risk and not to get out of bed. All questions and concerns addressed with instructions to call with any issues.     

## 2018-05-08 NOTE — Progress Notes (Signed)
LABOR PROGRESS NOTE  Megan Love is a 27 y.o. G1P0 at 4627w4d  admitted for IOL 2/2 htn   Subjective: Patient has increased discomfort from contractions.   Objective: BP (!) 142/76   Pulse 72   Temp 97.9 F (36.6 C) (Oral)   Resp 20   Ht 5\' 4"  (1.626 m)   Wt 77.6 kg   SpO2 99%   BMI 29.35 kg/m  or  Vitals:   05/07/18 2334 05/08/18 0001 05/08/18 0031 05/08/18 0157  BP: 133/90 128/88 128/85 (!) 142/76  Pulse: 73 82 (!) 59 72  Resp: 18 18  20   Temp:      TempSrc:      SpO2:      Weight:      Height:        ~0430 Dilation: 5.5 Effacement (%): 80, 90 Cervical Position: Posterior Station: -1 Presentation: Vertex Exam by:: Irving BurtonEmily Rothermel RN  FHT: baseline rate 125, moderate varibility, positive acel, negative decel Toco: 2-3 minutes, mild  Labs: Lab Results  Component Value Date   WBC 7.9 05/07/2018   HGB 12.7 05/07/2018   HCT 38.3 05/07/2018   MCV 91.4 05/07/2018   PLT 216 05/07/2018    Patient Active Problem List   Diagnosis Date Noted  . Post-dates pregnancy 05/07/2018  . UTI in pregnancy, antepartum, third trimester 03/19/2018  . Supervision of other normal pregnancy, antepartum 03/06/2018    Assessment / Plan: 27 y.o. G1P0 at 9227w4d here for IOL 2/2 HTN.   Labor: FB spontaneously removed. Cervix is progressing to 5.5/80%/-1. Will continue with expectant management at this time.  Fetal Wellbeing:  Category I Pain Control:  Received fentanyl x2, patient planning for epidural placement Anticipated MOD:  vaginal  Chelsey Anderson DO  05/08/2018, 4:49 AM

## 2018-05-08 NOTE — Progress Notes (Signed)
  Subjective:   Megan Love is a 27 YO G1P0 at 3058w4d here for IOL for GHTN. Patient is comfortable with epidural.  Objective:   BP 109/79   Pulse 79   Temp 98.6 F (37 C) (Oral)   Resp 16   Ht 5\' 4"  (1.626 m)   Wt 77.6 kg   SpO2 100%   BMI 29.35 kg/m  No intake/output data recorded. No intake/output data recorded.  FHT: Category 1 Fetal Strip  UC:  Irregular uterine contractions, every 2-5 min.  SVE:   Dilation: 7 Effacement (%): 80 Station: -1 Exam by:: Megan Love, SNM  AROM: 1045, clear odorless fluid. Moderate amount.   Labs: Lab Results  Component Value Date   WBC 15.2 (H) 05/08/2018   HGB 13.0 05/08/2018   HCT 38.0 05/08/2018   MCV 91.3 05/08/2018   PLT 215 05/08/2018     Assessment / Plan: IUP at term  GHTN Active Labor  Labor: progressing as expected Progressing normally Preeclampsia:  labs stable Fetal Wellbeing: category 1 strip  Category I Pain Control:  pain well controlled  Epidural I/D:  n/a Anticipated MOD:  Vaginal, recheck in 2 hours NSVD   Sigurd SosStephenia M Jakaden Ouzts 05/08/2018, 10:44 AM

## 2018-05-08 NOTE — Progress Notes (Signed)
   Subjective: 27YO G1P0 at 3640w4days here for IOL for GHTN   Objective: BP 110/70   Pulse 85   Temp 98.5 F (36.9 C) (Oral)   Resp 18   Ht 5\' 4"  (1.626 m)   Wt 77.6 kg   SpO2 100%   BMI 29.35 kg/m  No intake/output data recorded. No intake/output data recorded.  FHT: category 1  UC:   irregular, every 3-6 minutes SVE:   Dilation: 7 Effacement (%): 90 Station: -1 Exam by:: S. Chang Tiggs, SNM  Labs: Lab Results  Component Value Date   WBC 15.2 (H) 05/08/2018   HGB 13.0 05/08/2018   HCT 38.0 05/08/2018   MCV 91.3 05/08/2018   PLT 215 05/08/2018    Assessment / Plan: 27YO G1PO IOL for GHTN, s/p cyto, foley bulb, AROM at 1045  Labor: active labor however, no cervical change since last check Preeclampsia:  labs stable Fetal Wellbeing:  Category I Pain Control:  Epidural I/D:  n/a Anticipated MOD:  NSVD  Plan is to start titration of low dose pitocin and recheck in 2 hours   Loyed Wilmes M Danamarie Minami 05/08/2018, 1:12 PM

## 2018-05-08 NOTE — Lactation Note (Signed)
This note was copied from a baby's chart. Lactation Consultation Note  Patient Name: Megan Love Today's Date: 05/08/2018 Reason for consult: Initial assessment;Primapara;1st time breastfeeding;Term  3 hours old FT female who is being exclusively BF by his mother, she's a P1. Mom [facility] have [facility] pump [facility], pump instructions, cleaning and storage were reviewed as well as milk storage guidelines. LC revised hand expression with mom and plenty of colostrum was noticed, mom has very large nipples, LC had to get a # 27 flange for her hand pump.  Offered assistance with latch but mom politely declined stating that baby already fed; he was asleep. Asked mom to call for latch assistance when needed. Discussed cluster feeding and feeding cues. Parents had questions about baby's Medicaid status, they're spanish speakers, SW needs to be following up with them. Parents will follow up with daytime nurse tomorrow.  Feeding plan:  1. Encouraged mom to feed baby STS 8-12 times/24 hours or sooner if feeding cues are present. 2. Hand expression and spoon feeding was also encouraged  BF brochure (SP), BF resources and feeding diary (SP) were reviewed, mom is aware of LC services and will call PRN.  Maternal Data Formula Feeding for Exclusion: No Has patient been taught Hand Expression?: Yes Does the patient have breastfeeding experience prior to this delivery?: No  Feeding Feeding Type: Breast Fed  LATCH Score Latch: Grasps breast easily, tongue down, lips flanged, rhythmical sucking.  Audible Swallowing: A few with stimulation  Type of Nipple: Everted at rest and after stimulation  Comfort (Breast/Nipple): Soft / non-tender  Hold (Positioning): Assistance needed to correctly position infant at breast and maintain latch.  LATCH Score: 8  Interventions Interventions: Breast feeding basics reviewed;Breast compression;Hand express;Breast  massage;Hand pump  Lactation Tools Discussed/Used Tools: Pump Breast pump type: Manual WIC Program: No Pump Review: Setup, frequency, and cleaning;Milk Storage Initiated by:: MPeck Date initiated:: 05/08/18   Consult Status Consult Status: Follow-up Date: 05/09/18 Follow-up type: In-patient    Jamilia Jacques Venetia ConstableS Chailyn Racette 05/08/2018, 7:01 PM

## 2018-05-09 NOTE — Anesthesia Postprocedure Evaluation (Signed)
Anesthesia Post Note  Patient: Megan Love  Procedure(s) Performed: AN AD HOC LABOR EPIDURAL     Patient location during evaluation: Mother Baby Anesthesia Type: Epidural Level of consciousness: awake and alert Pain management: pain level controlled Vital Signs Assessment: post-procedure vital signs reviewed and stable Respiratory status: spontaneous breathing, nonlabored ventilation and respiratory function stable Cardiovascular status: stable Postop Assessment: no headache, no backache, epidural receding, able to ambulate, adequate PO intake, no apparent nausea or vomiting and patient able to bend at knees Anesthetic complications: no    Last Vitals:  Vitals:   05/08/18 2200 05/09/18 0300  BP: 110/73 (!) 96/57  Pulse: 75 61  Resp: 18 16  Temp:  36.7 C  SpO2:  99%    Last Pain:  Vitals:   05/09/18 0800  TempSrc:   PainSc: 0-No pain   Pain Goal:                 [place] O'LakesMalinova,Aarit Kashuba Hristova

## 2018-05-09 NOTE — Clinical Social Work Maternal (Signed)
CLINICAL SOCIAL WORK MATERNAL/CHILD NOTE  Patient Details  Name: Megan Love MRN: [id number] Date of Birth: 09/14/1990  Date:  05/09/2018  Clinical Social Worker Initiating Note:  Laurey Arrow Date/Time: Initiated:  05/09/18/1121     Child's Name:  Megan Love    Biological Parents:  Mother, Father   Need for Interpreter:  None   Reason for Referral:  Late or No Prenatal Care ([facility] initated at 31 weeks. )   Address:  [address] [place] [address]    Phone number:  [phone] (home)     Additional phone number:   Household Members/Support Persons (HM/SP):   Household Member/Support Person 1   HM/SP Name Relationship DOB or Age  HM/SP -62 Miguel Martinze(MOB reported that MOB and FOB have 2 roommates (names were not provided).) FOB 08/04/1990  HM/SP -2        HM/SP -3        HM/SP -4        HM/SP -5        HM/SP -6        HM/SP -7        HM/SP -8          Natural Supports (not living in the home):      Professional Supports: None   Employment: Unemployed   Type of Work:     Education:  Programmer, systems   Homebound arranged:    Pensions consultant:  (CSW provided MOB will information to apply for [employer], Medicaid, and Physicist, medical, )   Other Resources:      Cultural/Religious Considerations Which May Impact Care:  None Reported  Strengths:  Ability to meet basic needs , Home prepared for child , Pediatrician chosen   Psychotropic Medications:         Pediatrician:    [place] (including [place])  Pediatrician List:   [place]      Pediatrician Fax Number:    Risk Factors/Current Problems:  Transportation    Cognitive State:  Able to Concentrate , Alert , Linear Thinking , Insightful , Goal Oriented    Mood/Affect:  Interested , Happy , Relaxed , Bright    CSW Assessment: CSW met with  MOB to complete an assessment for late [facility]. CSW utilized hospital interpreter to assist with language barrier.   When CSW arrived, MOB was resting in bed, infant was asleep in bassinet, and FOB was resting on couch. MOB gave CSW permission to complete the assessment while FOB was present. CSW explained CSW's role and encouraged MOB to ask questions. CSW inquired about MOB's  Late PNC and MOB reported that MOB was new to the area and started care as soon as she could. CSW explained the hospital's late [facility] policy; MOB  understood. MOB denied the use of all illicit substance. CSW made MOB aware that CSW will monitor infant's UDS and CDS and will make a report to [place] if warranted.    CSW educated MOB about PPD and informed MOB of possible supports and interventions to decrease PPD.  CSW also encouraged MOB to seek medical attention if needed for increased signs and symptoms for PPD.   CSW reviewed safe sleep and SIDS. MOB was knowledgeable.  MOB did not have any questions or concerns at this time, and CSW thanked MOB for allowing CSW  to meet with MOB.  CSW Plan/Description:  No Further Intervention Required/No Barriers to Discharge, Sudden Infant Death Syndrome (SIDS) Education, Perinatal Mood and Anxiety Disorder (PMADs) Education, [place], CSW Will Continue to Monitor Umbilical Cord Tissue Drug Screen Results and Make Report if [place], Other Patient/Family Education   Laurey Arrow, MSW, LCSW Clinical Social Work [phone]   Dimple Nanas, LCSW 05/09/2018, 1:39 PM

## 2018-05-09 NOTE — Progress Notes (Signed)
POSTPARTUM PROGRESS NOTE  Post Partum Day 1  Subjective:  Megan Love is a 27 y.o. G1P1001 s/p SVD at 2975w4d.  She reports she is doing well. No acute events overnight. She denies any problems with ambulating, voiding or po intake. Denies nausea or vomiting.  Pain is well controlled.  Lochia is mild.  Objective: Blood pressure (!) 96/57, pulse 61, temperature 98.1 F (36.7 C), temperature source Oral, resp. rate 16, height 5\' 4"  (1.626 m), weight 77.6 kg, SpO2 99 %, unknown if currently breastfeeding.  Physical Exam:  General: alert, cooperative and no distress Chest: no respiratory distress Heart:regular rate, distal pulses intact Abdomen: soft, nontender,  Uterine Fundus: firm, appropriately tender DVT Evaluation: No calf swelling or tenderness Extremities: no edema Skin: warm, dry  Recent Labs    05/07/18 2058 05/08/18 0454  HGB 12.7 13.0  HCT 38.3 38.0    Assessment/Plan: Megan Love is a 27 y.o. G1P1001 s/p SVD at 375w4d   PPD#1 - Doing well  Routine postpartum care  Contraception: Depo Feeding: Breast Dispo: Plan for discharge tomorrow.   LOS: 2 days    Genene ChurnJohn Cook, MS3 05/09/2018, 7:23 AM   Midwife attestation Post Partum Day 1 I have seen and examined this patient and agree with above documentation in the student's note.   Megan Love is a 27 y.o. G1P1001 s/p NSVD.  Pt denies problems with ambulating, voiding or po intake. Pain is well controlled.  Plan for birth control is Depo-Provera.  Method of Feeding: breast  PE:  BP 100/63 (BP Location: Right Arm)   Pulse 70   Temp 97.7 F (36.5 C) (Oral)   Resp 16   Ht 5\' 4"  (1.626 m)   Wt 77.6 kg   SpO2 99%   Breastfeeding? Unknown   BMI 29.35 kg/m  Gen: well appearing Heart: reg rate Lungs: normal WOB Fundus firm Ext: soft, no pain, no edema  Plan for discharge: tomorrow  Sharen CounterLisa Leftwich-Kirby, CNM 8:46 PM

## 2018-05-10 MED ORDER — IBUPROFEN 600 MG PO TABS: 600.0000 mg | ORAL_TABLET | Freq: Four times a day (QID) | ORAL | 1 refills | Status: DC

## 2018-05-10 MED ORDER — SENNOSIDES-DOCUSATE SODIUM 8.6-50 MG PO TABS: 2.0000 | ORAL_TABLET | ORAL | 0 refills | Status: DC

## 2018-05-10 NOTE — Discharge Instructions (Signed)
Parto vaginal, cuidados de puerperio °(Postpartum Care After Vaginal Delivery) °El período de tiempo que sigue inmediatamente al parto se conoce como puerperio. °¿QUÉ TIPO DE ATENCIÓN MÉDICA RECIBIRÉ? °· Podría continuar recibiendo medicamentos y líquidos través de una vía intravenosa (IV) que se colocará en una de sus venas. °· Si se le realizó una incisión cerca de la vagina (episiotomía) o si ha tenido algún desgarro durante el parto, podrían indicarle que se coloque compresas frías sobre la episiotomía o el desgarro. Esto ayuda a aliviar el dolor y la hinchazón. °· Es posible que le den una botella rociadora para que use cuando vaya al baño. Puede utilizarla hasta que se sienta cómoda limpiándose de la manera habitual. Siga los pasos a continuación para usar la botella rociadora: °? Antes de orinar, llene la botella rociadora con agua tibia. No use agua caliente. °? Después de orinar, mientras aún está sentada en el inodoro, use la botella rociadora para enjuagar el área alrededor de la uretra y la abertura vaginal. Con esto podrá limpiar cualquier rastro de orina y sangre. °? Puede hacer esto en lugar de secarse. Cuando comience a sanar, podrá usar la botella rociadora antes de secarse. Asegúrese de secarse suavemente. °? Llene la botella rociadora con agua limpia cada vez que vaya al baño. °· Deberá usar apósitos sanitarios. °¿CÓMO PUEDO SENTIRME? °· Quizás no tenga necesidad de orinar durante varias horas después del parto. °· Sentirá algo de dolor y molestias en el abdomen y la vagina. °· Si está amamantando, podría tener contracciones uterinas cada vez que lo haga. Estas podrían prolongarse hasta varias semanas durante el puerperio. Las contracciones uterinas ayudan al útero a regresar a su tamaño habitual. °· Es normal tener un poco de hemorragia vaginal (loquios) después del parto. La cantidad y apariencia de los loquios a menudo es similar a las del período menstrual la primera semana después del parto.  Disminuirá gradualmente las siguientes semanas hasta convertirse en una descarga seca amarronada o amarillenta. En la mayoría de las mujeres, los loquios se detienen completamente entre 6 a 8 semanas después del parto. Los sangrados vaginales pueden variar de mujer a mujer. °· Los primeros días después del parto, podría padecer congestión mamaria. Los pechos se sentirán pesados, llenos y molestos. Las mamas también podrían latir y ponerse duras, muy tirantes, calientes y sensibles al tacto. Cuando esto ocurra, podría notar leche que se escapa de los senos. El médico puede recomendarle algunos métodos para aliviar este malestar causado por la congestión mamaria. La congestión mamaria debería desaparecer al cabo de unos días. °· Podría sentirse más deprimida o preocupada que lo habitual debido a los cambios hormonales luego del parto. Estos sentimientos no deben durar más de unos pocos días. Si no desaparecen al cabo de algunos días, hable con su médico. °¿QUÉ CUIDADOS DEBO TENER? °· Infórmele a su médico si siente dolor o malestar. °· Beba suficiente agua para mantener la orina clara o de color amarillo pálido. °· Lávese bien las manos con agua y jabón durante al menos 20 segundos después de cambiar el apósito sanitario, usar el baño o antes de sostener o alimentar al bebé. °· Si no está amamantando, evite tocarse mucho los senos. Al hacerlo, podrían producir más leche. °· Si se siente débil o mareada, o si siente que está a punto de desmayarse, pida ayuda antes de realizar lo siguiente: °? Levantarse de la cama. °? Ducharse. °· Cambie los apósitos sanitarios con frecuencia. Observe si hay cambios en el flujo, como un aumento repentino en el   volumen, cambios en el color o coágulos sanguíneos de gran tamaño. Si expulsa un coágulo sanguíneo por la vagina, guárdelo para mostrárselo a su médico. No tire la cadena sin que el médico examine el coágulo antes. °· Asegúrese de tener todas las vacunas al día. Esto la ayudará a  estar protegida y a proteger al bebé de determinadas enfermedades. Podría necesitar vacunas antes de dejar el hospital. °· Si lo desea, hable con el médico acerca de los métodos de planificación familiar o control de la natalidad (métodos anticonceptivos). °¿CÓMO PUEDO ESTABLECER LAZOS CON MI BEBÉ? °Pasar tanto tiempo como le sea posible con el bebé es sumamente importante. Durante ese tiempo, usted y su bebé pueden conocerse y desarrollar lazos. Tener al bebé con usted en la habitación le dará tiempo de conocerlo. Esto también puede hacerla sentir más cómoda para atender al bebé. Amamantar también puede ayudarla a crear lazos con el bebé. °¿CÓMO PUEDO PLANIFICAR MI REGRESO A CASA CON EL BEBÉ? °· Asegúrese de tener instalada una butaca en el automóvil. °? La butaca debe contar con la certificación del fabricante para asegurarse de que esté instalada en forma segura. °? Asegúrese de que el bebé quede bien asegurado en la butaca. °· Pregúntele al médico todo lo que necesite saber sobre los cuidados de su bebé. Asegúrese de poder comunicarse con el médico en caso de que tenga preguntas luego de dejar el hospital. °Esta información no tiene como fin reemplazar el consejo del médico. Asegúrese de hacerle al médico cualquier pregunta que tenga. °Document Released: 03/17/2007 Document Revised: 09/11/2015 Document Reviewed: 04/24/2015 °Elsevier Interactive Patient Education © 2018 [employer] ° °

## 2018-05-10 NOTE — Lactation Note (Signed)
This note was copied from a baby's chart. Lactation Consultation Note: Spanish Interpreter at the bedside for all discharge teaching.   Mother breastfeeding infant when I arrived in the room. Mother sitting up leaning forward.  Infant latched on with shallow latch. Infant repositioned closer to the breast. Observed wide open mouth with good burst of suckling.   Mother advised to cue base feed infant and to feed at least 8-12 times or more in 24 hours.  Discussed cluster feeding.   Discussed treatment and prevention of engorgement.  Mother denies having any discomfort with feeding.  Mother has a hand pump and has understanding of use.   Mother receptive to all teaching. Mother is aware of available LC services.     Patient Name: Megan Donavan FoilKarla Yessenia Herrera Villalta Today's Date: 05/10/2018 Reason for consult: Follow-up assessment   Maternal Data    Feeding Feeding Type: Breast Fed  LATCH Score Latch: Grasps breast easily, tongue down, lips flanged, rhythmical sucking.  Audible Swallowing: A few with stimulation  Type of Nipple: Everted at rest and after stimulation  Comfort (Breast/Nipple): Soft / non-tender  Hold (Positioning): Assistance needed to correctly position infant at breast and maintain latch.  LATCH Score: 8  Interventions Interventions: Breast massage;Adjust position;Support pillows;Position options;Hand pump  Lactation Tools Discussed/Used     Consult Status Consult Status: Complete    Michel BickersKendrick, Renda Pohlman McCoy 05/10/2018, 11:51 AM

## 2018-05-10 NOTE — Discharge Summary (Signed)
OB Discharge Summary     Patient Name: Megan Love DOB: 1991-04-15 MRN: [id number]  Date of admission: 05/07/2018 Delivering MD: Cam Hai D   Date of discharge: 05/10/2018  Admitting diagnosis: 38 WKS, BLEEDING, CTX Intrauterine pregnancy: [redacted]w[redacted]d     Secondary diagnosis:  Active Problems:   Post-dates pregnancy  Additional problems: Gestational HTN      Discharge diagnosis: Term Pregnancy Delivered                                                                                                Post partum procedures:None   Augmentation: AROM, Pitocin and Foley Balloon  Complications: None  Hospital course:  Induction of Labor With Vaginal Delivery   27 y.o. yo G1P1001 at [redacted]w[redacted]d was admitted to the hospital 05/07/2018 for induction of labor.  Indication for induction: Gestational hypertension.  Patient had an uncomplicated labor course as follows: Membrane Rupture Time/Date: 10:36 AM ,05/08/2018   Intrapartum Procedures: Episiotomy: None [1]                                         Lacerations:  Vaginal [6];Labial [10]  Patient had delivery of a Viable infant.  Information for the patient's newborn:  Megan Love, Boy Momoko Slezak [[id number]]  Delivery Method: Vag-Spont   05/08/2018  Details of delivery can be found in separate delivery note.  Patient had a routine postpartum course. Patient is discharged home 05/10/18.  Physical exam  Vitals:   05/09/18 0300 05/09/18 1405 05/09/18 2100 05/10/18 0541  BP: (!) 96/57 100/63 111/63 100/65  Pulse: 61 70 75 (!) 55  Resp: 16 16 16 16   Temp: 98.1 F (36.7 C) 97.7 F (36.5 C) 98.1 F (36.7 C) 98 F (36.7 C)  TempSrc: Oral Oral Oral Oral  SpO2: 99% 99%    Weight:      Height:       General: alert Lochia: appropriate Uterine Fundus: firm Incision: N/A DVT Evaluation: No evidence of DVT seen on physical exam. Labs: Lab Results  Component Value Date   WBC 15.2 (H) 05/08/2018   HGB 13.0  05/08/2018   HCT 38.0 05/08/2018   MCV 91.3 05/08/2018   PLT 215 05/08/2018   CMP Latest Ref Rng & Units 05/07/2018  Glucose 70 - 99 mg/dL 89  BUN 6 - 20 mg/dL 11  Creatinine 7.82 - 9.56 mg/dL 2.13  Sodium 086 - 578 mmol/L 136  Potassium 3.5 - 5.1 mmol/L 3.9  Chloride 98 - 111 mmol/L 108  CO2 22 - 32 mmol/L 19(L)  Calcium 8.9 - 10.3 mg/dL 9.0  Total Protein 6.5 - 8.1 g/dL 7.0  Total Bilirubin 0.3 - 1.2 mg/dL 0.5  Alkaline Phos 38 - 126 U/L 212(H)  AST 15 - 41 U/L 29  ALT 0 - 44 U/L 24    Discharge instruction: per After Visit Summary and "Baby and Me Booklet".  After visit meds:  Allergies as of 05/10/2018   No Known Allergies  Medication List    TAKE these medications   ibuprofen 600 MG tablet Commonly known as:  ADVIL,MOTRIN Take 1 tablet (600 mg total) by mouth every 6 (six) hours.   PRENATAL VITAMIN PO Take 1 tablet by mouth daily.   senna-docusate 8.6-50 MG tablet Commonly known as:  Senokot-S Take 2 tablets by mouth daily. Start taking on:  05/11/2018       Diet: routine diet  Activity: Advance as tolerated. Pelvic rest for 6 weeks.   Outpatient follow up:4 weeks; 1 week for BP check  Follow up Appt:No future appointments. Follow up Visit:No follow-ups on file.  Postpartum contraception: Depo Provera  Newborn Data: Live born female  Birth Weight: 7 lb 12.7 oz (3535 g) APGAR: 9, 9  Newborn Delivery   Birth date/time:  05/08/2018 15:29:00 Delivery type:  Vaginal, Spontaneous     Baby Feeding: Breast Disposition:home with mother   05/10/2018 De Hollingsheadatherine L Garion Wempe, DO

## 2018-05-11 ENCOUNTER — Inpatient Hospital Stay (HOSPITAL_COMMUNITY): Admission: RE | Admit: 2018-05-11 | Payer: Self-pay | Source: Ambulatory Visit

## 2018-05-15 ENCOUNTER — Ambulatory Visit (INDEPENDENT_AMBULATORY_CARE_PROVIDER_SITE_OTHER): Payer: Self-pay | Admitting: *Deleted

## 2018-05-15 VITALS — BP 118/77 | HR 68

## 2018-05-15 DIAGNOSIS — Z0130 Encounter for examination of blood pressure without abnormal findings: Secondary | ICD-10-CM

## 2018-05-15 DIAGNOSIS — Z8759 Personal history of other complications of pregnancy, childbirth and the puerperium: Secondary | ICD-10-CM

## 2018-05-15 NOTE — Progress Notes (Signed)
I have reviewed this chart and agree with the RN/CMA assessment and management.    Nohemy Koop C Kirsti Mcalpine, MD, FACOG Attending Physician, [facility]  

## 2018-05-15 NOTE — Progress Notes (Signed)
Here for bp check. Vaginal delivery 05/08/18 GHTN.  BP wnl. Advised to keep postpartum appointment as scheduled and to report to mau if she has severe headaches or sudden edema.she voices understanding.  Delshon Blanchfield,RN

## 2018-06-09 ENCOUNTER — Encounter: Payer: Self-pay | Admitting: *Deleted

## 2018-06-12 ENCOUNTER — Encounter: Payer: Self-pay | Admitting: Obstetrics & Gynecology

## 2018-06-12 ENCOUNTER — Ambulatory Visit (INDEPENDENT_AMBULATORY_CARE_PROVIDER_SITE_OTHER): Payer: Self-pay | Admitting: Obstetrics & Gynecology

## 2018-06-12 DIAGNOSIS — Z789 Other specified health status: Secondary | ICD-10-CM

## 2018-06-12 MED ORDER — NORETHINDRONE 0.35 MG PO TABS: 1.0000 | ORAL_TABLET | Freq: Every day | ORAL | 11 refills | Status: AC

## 2018-06-12 NOTE — Addendum Note (Signed)
Addended by: Allie Bossier on: 06/12/2018 10:19 AM   Modules accepted: Orders

## 2018-06-12 NOTE — Progress Notes (Addendum)
Subjective:     Megan KendallKarla Yessenia Herrera Love is a 28 y.o. female P1 who presents for a postpartum visit. She is 4 weeks postpartum following a spontaneous vaginal delivery. She had augmentation of labor at 40.3 weeks [facility] and latent labor.  I have fully reviewed the prenatal and intrapartum course.  Outcome: spontaneous vaginal delivery. Anesthesia: none. Postpartum course has been normal. Baby's course has been normal. Baby is feeding by breast. Bleeding no bleeding. Bowel function is normal. Bladder function is normal. Patient is not sexually active. Contraception method is abstinence. She would like depo provera in the near future.  Postpartum depression screening: negative.  The following portions of the patient's history were reviewed and updated as appropriate: allergies, current medications, past family history, past medical history, past social history, past surgical history and problem list.  Review of Systems Pertinent items are noted in HPI.   Objective:    There were no vitals taken for this visit.  Interpretor present for the exam.  General:  alert   Breasts:  inspection negative, no nipple discharge or bleeding, no masses or nodularity palpable  Lungs: clear to auscultation bilaterally  Heart:  regular rate and rhythm, S1, S2 normal, no murmur, click, rub or gallop  Abdomen: soft, non-tender; bowel sounds normal; no masses,  no organomegaly                    EG-normal Vagina- normal Uterus- normal size and shape, anteverted, mobile, non-tender, normal adnexal exam                                          Assessment:   Normal postpartum exam. Pap smear not done at today's visit.   Plan:    1. Contraception: Depo-Provera injections. She will call the health dept today to get scheduled for that. Rec condoms prn until infection She also wants to start on POPs until she get the depo. Instructions given 2. She will get her pap smear done at the health  dept
